# Patient Record
Sex: Male | Born: 1980 | Race: White | Hispanic: No | Marital: Married | Smoking: Never smoker
Health system: Southern US, Community
[De-identification: ages and names within clinical notes are randomized; demographics above are authoritative.]

## PROBLEM LIST (undated history)

## (undated) DIAGNOSIS — R030 Elevated blood-pressure reading, without diagnosis of hypertension: Secondary | ICD-10-CM

## (undated) DIAGNOSIS — E349 Endocrine disorder, unspecified: Secondary | ICD-10-CM

## (undated) DIAGNOSIS — IMO0001 Reserved for inherently not codable concepts without codable children: Secondary | ICD-10-CM

## (undated) HISTORY — DX: Endocrine disorder, unspecified: E34.9

## (undated) HISTORY — DX: Reserved for inherently not codable concepts without codable children: IMO0001

## (undated) HISTORY — DX: Elevated blood-pressure reading, without diagnosis of hypertension: R03.0

## (undated) HISTORY — PX: HERNIA REPAIR: SHX51

## (undated) HISTORY — PX: BACK SURGERY: SHX140

---
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
## 2008-02-13 ENCOUNTER — Emergency Department: Payer: Self-pay | Admitting: Emergency Medicine

## 2009-10-05 IMAGING — CR DG CHEST 2V
1 series · 2 of 2 positions shown · non-contrast
Comparison: none

REASON FOR EXAM: cough, fever, sob
COMMENTS:   LMP: male

PROCEDURE:     DXR - DXR CHEST PA (OR AP) AND LATERAL  - February 13, 2008  [DATE]
RESULT:     The lung fields are clear. The heart, mediastinal and osseous
structures show no significant abnormalities.

[Series 1: view not recorded · 0.17mm/px · 2 of 2 slices shown]
[im 1/2]
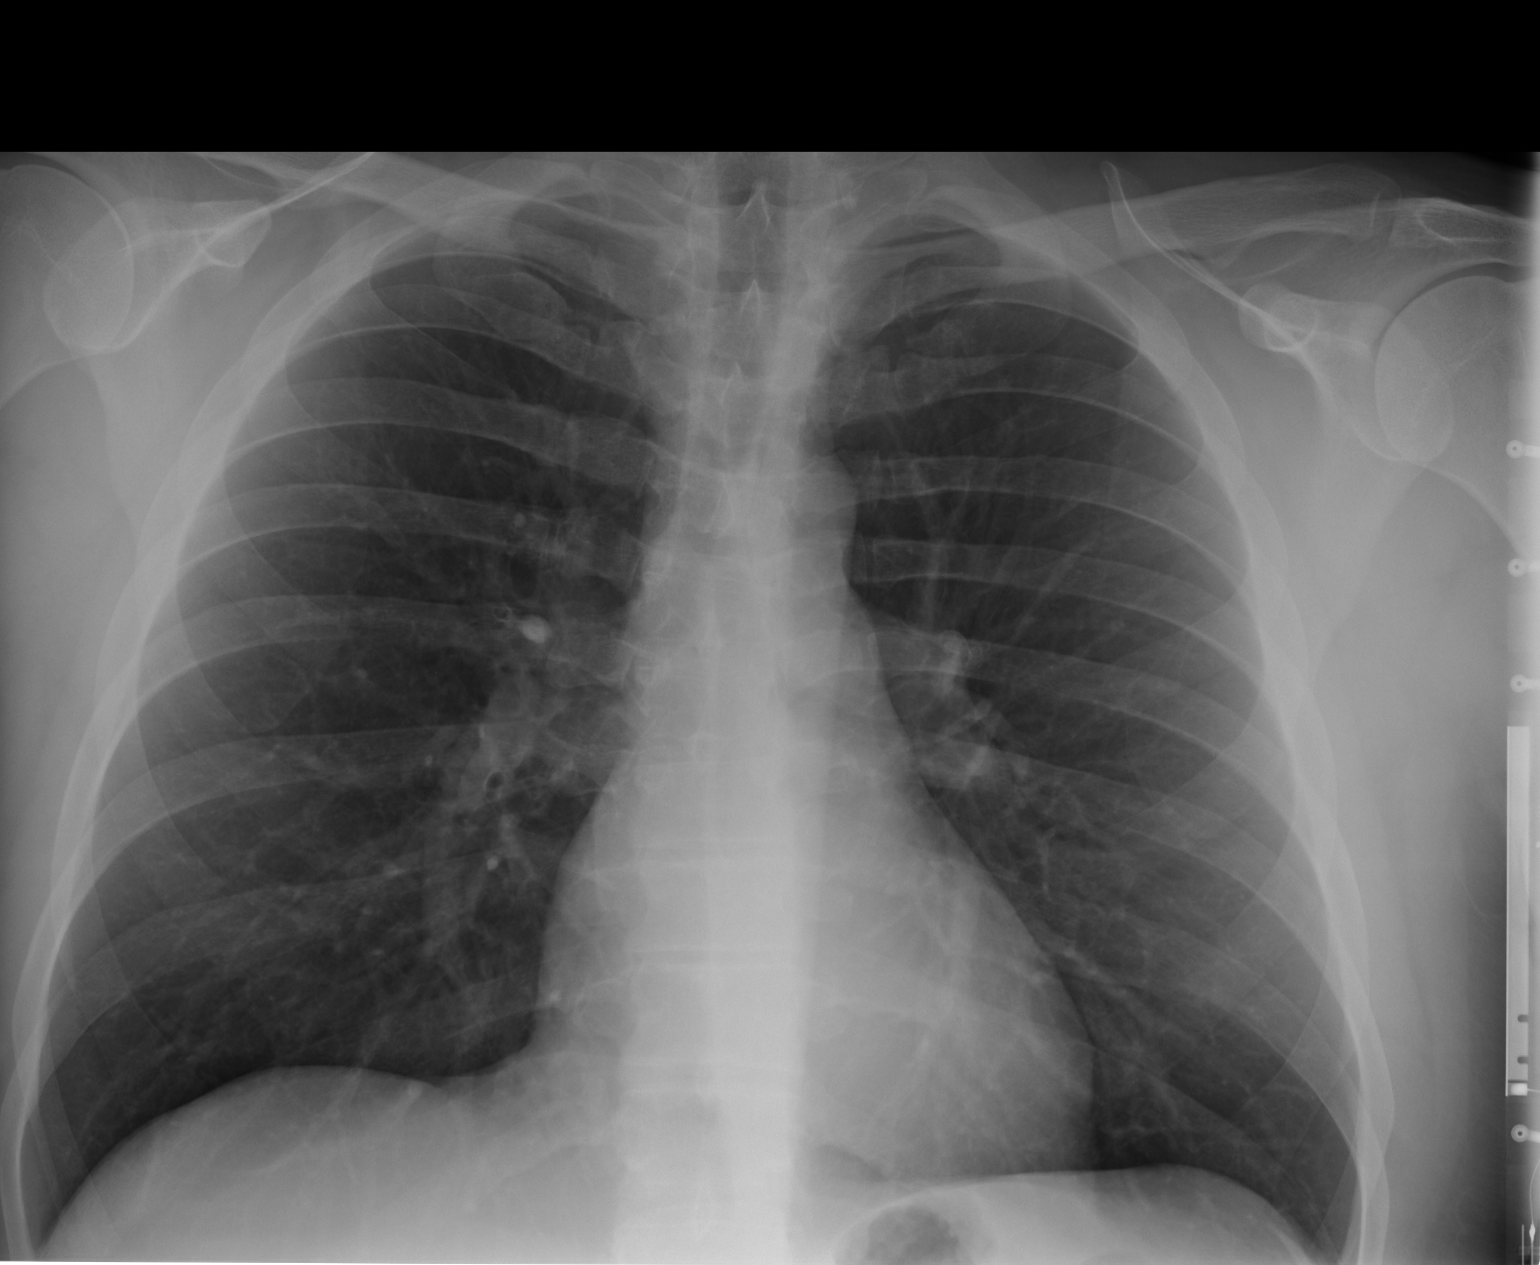
[im 2/2]
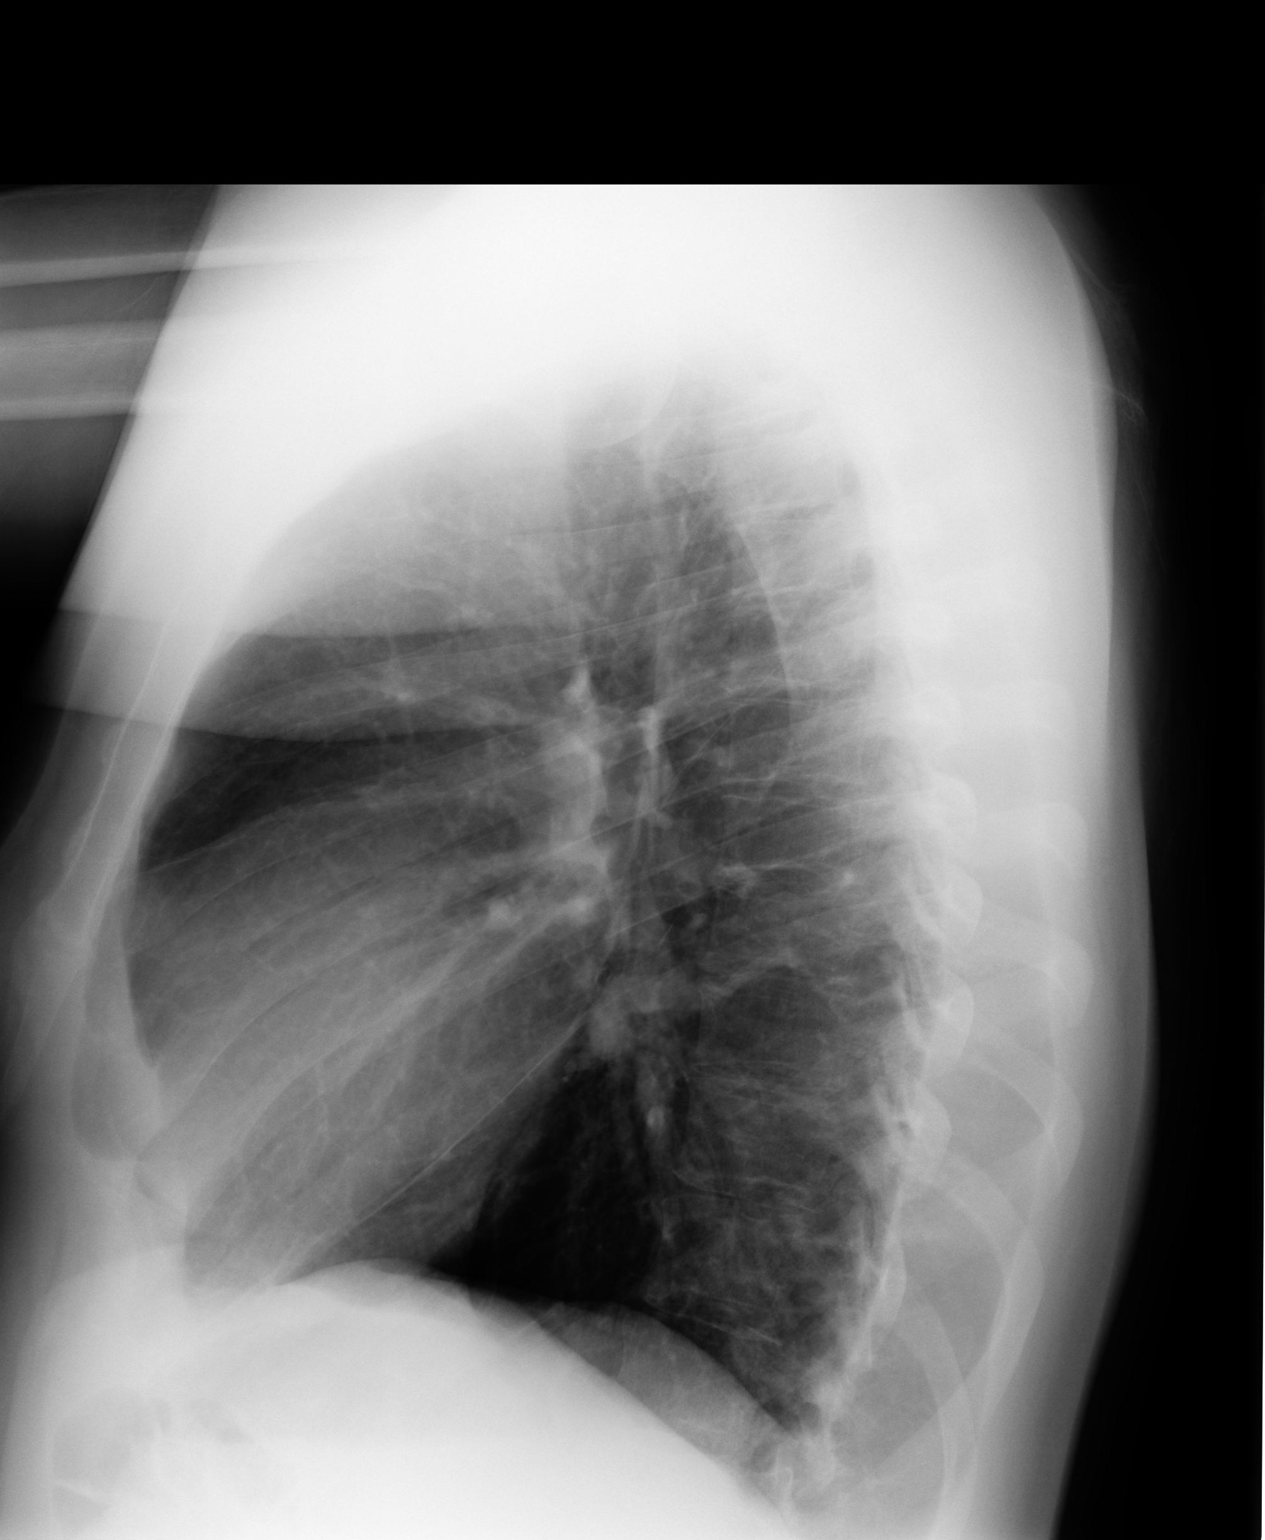

[2 of 2 positions shown; findings below may reference images not displayed]

IMPRESSION: No significant abnormalities are noted.

## 2013-06-15 ENCOUNTER — Emergency Department: Payer: Self-pay | Admitting: Emergency Medicine

## 2014-05-15 ENCOUNTER — Encounter: Payer: Self-pay | Admitting: Primary Care

## 2014-05-15 ENCOUNTER — Encounter (INDEPENDENT_AMBULATORY_CARE_PROVIDER_SITE_OTHER): Payer: Self-pay

## 2014-05-15 ENCOUNTER — Ambulatory Visit (INDEPENDENT_AMBULATORY_CARE_PROVIDER_SITE_OTHER): Payer: 59 | Admitting: Primary Care

## 2014-05-15 VITALS — BP 126/84 | HR 72 | Temp 98.3°F | Ht 73.0 in | Wt 260.8 lb

## 2014-05-15 DIAGNOSIS — N529 Male erectile dysfunction, unspecified: Secondary | ICD-10-CM

## 2014-05-15 DIAGNOSIS — R5383 Other fatigue: Secondary | ICD-10-CM

## 2014-05-15 DIAGNOSIS — R079 Chest pain, unspecified: Secondary | ICD-10-CM | POA: Diagnosis not present

## 2014-05-15 NOTE — Progress Notes (Signed)
Pre visit review using our clinic review tool, if applicable. No additional management support is needed unless otherwise documented below in the visit note. 

## 2014-05-15 NOTE — Progress Notes (Signed)
Subjective:    Patient ID: Daniel Sosa, male    DOB: 18-Jun-1980, 34 y.o.   MRN: [id number]  HPI  [name] is a 34 year old male who presents today to establish care and discuss the problems mentioned below. Will obtain old records.  1) Elevated blood pressure readings: He's been checking his pressures occasionally at [facility] and [facility]. Last week at [facility] his blood pressure wa 155/91, today at [facility] was 151/89. Denies headaches, chest pain. He's cut back on his soda's for the last several months and has one with dinner. Diet consists of a protein bar in the morning, skips lunch, and will eat out and eat tacos, burgers, etc for dinner. He is drinking mostly water, about 6 bottles a day. He will occasionally drink beer. He's lost 10 pounds in the past month due to stress.   BP Readings from Last 3 Encounters:  05/15/14 126/84   2) Chest pain: Started about one month ago and is located to the left side. He describes it as an intermittent, achy pain. Each episode will last for a few minutes and will occur 2 times a week. Denies palpitations and shortness of breath.  3) Decreased libido: Cannot maintain an errection for the past month. Denies penile discharge or testicular pain/abnormalities. Does not take medication over the counter except OTC ibuprofen. He's not using condoms or lube with scents. Consistently happening over the past couple of weeks. Will start out strong and then soften. He also reports generalized fatigue and feels tired often.  Review of Systems  Constitutional: Negative for unexpected weight change.  HENT: Negative for rhinorrhea.   Respiratory: Negative for cough and shortness of breath.   Cardiovascular: Positive for chest pain. Negative for palpitations and leg swelling.  Genitourinary: Negative for dysuria, frequency, penile swelling, scrotal swelling and testicular pain.  Musculoskeletal: Negative for myalgias, joint swelling and arthralgias.  Skin: Negative for  rash.  Neurological: Negative for dizziness and headaches.  Psychiatric/Behavioral:       Denies concerns for anxiety or depression       Past Medical History  Diagnosis Date  . Elevated blood pressure     History   Social History  . Marital Status: Legally Separated    Spouse Name: N/A  . Number of Children: N/A  . Years of Education: N/A   Occupational History  . Not on file.   Social History Main Topics  . Smoking status: Never Smoker   . Smokeless tobacco: Not on file  . Alcohol Use: No  . Drug Use: Not on file  . Sexual Activity: Not on file   Other Topics Concern  . Not on file   Social History Narrative   Single.   Works for [employer] for 3 years.   Had 1 daughter, 63 year old.   Enjoys playing tennis, basketball.       Past Surgical History  Procedure Laterality Date  . Back surgery    . Hernia repair      Age 9    Family History  Problem Relation Age of Onset  . Cancer Mother     Cervical   . Hypertension Mother   . Stroke Maternal Grandmother     No Known Allergies  No current outpatient prescriptions on file prior to visit.   No current facility-administered medications on file prior to visit.    BP 126/84 mmHg  Pulse 72  Temp(Src) 98.3 F (36.8 C) (Oral)  Ht  (1.854  m)  Wt 260 lb 12.8 oz (118.298 kg)  BMI 34.42 kg/m2  SpO2 97%    Objective:   Physical Exam  Constitutional: He is oriented to person, place, and time. He appears well-developed.  HENT:  Right Ear: External ear normal.  Left Ear: External ear normal.  Nose: Nose normal.  Mouth/Throat: Oropharynx is clear and moist.  Eyes: EOM are normal. Pupils are equal, round, and reactive to light.  Neck: Neck supple. No thyromegaly present.  Cardiovascular: Normal rate and regular rhythm.   Pulmonary/Chest: Effort normal and breath sounds normal.  Abdominal: Soft. Bowel sounds are normal. There is no tenderness.  Musculoskeletal: Normal range of motion.    Lymphadenopathy:    He has no cervical adenopathy.  Neurological: He is alert and oriented to person, place, and time.  Skin: Skin is warm and dry.  Psychiatric: He has a normal mood and affect.          Assessment & Plan:

## 2014-05-15 NOTE — Patient Instructions (Signed)
Complete lab work prior to leaving today. I will notify you of your results. Measure your blood pressure daily and start recording. Bring them with you to your next appointment.  Schedule a physical with me in the next 2 weeks. Make a lab appointment to obtain your cholesterol level, come fasting at least 4 hours. It was a pleasure to meet you today! Please don't hesitate to call me with any questions. Welcome to [facility]!

## 2014-05-16 ENCOUNTER — Telehealth: Payer: Self-pay | Admitting: Primary Care

## 2014-05-16 DIAGNOSIS — R079 Chest pain, unspecified: Secondary | ICD-10-CM | POA: Insufficient documentation

## 2014-05-16 DIAGNOSIS — R5383 Other fatigue: Secondary | ICD-10-CM | POA: Insufficient documentation

## 2014-05-16 DIAGNOSIS — N529 Male erectile dysfunction, unspecified: Secondary | ICD-10-CM | POA: Insufficient documentation

## 2014-05-16 DIAGNOSIS — E349 Endocrine disorder, unspecified: Secondary | ICD-10-CM | POA: Insufficient documentation

## 2014-05-16 LAB — CBC WITH DIFFERENTIAL/PLATELET
Basophils Absolute: 0 10*3/uL (ref 0.0–0.1)
Basophils Relative: 0.5 % (ref 0.0–3.0)
Eosinophils Absolute: 0.2 10*3/uL (ref 0.0–0.7)
Eosinophils Relative: 2 % (ref 0.0–5.0)
HCT: 44.7 % (ref 39.0–52.0)
Hemoglobin: 15.2 g/dL (ref 13.0–17.0)
Lymphocytes Relative: 24.4 % (ref 12.0–46.0)
Lymphs Abs: 2.1 10*3/uL (ref 0.7–4.0)
MCHC: 34 g/dL (ref 30.0–36.0)
MCV: 87.4 fl (ref 78.0–100.0)
Monocytes Absolute: 0.5 10*3/uL (ref 0.1–1.0)
Monocytes Relative: 5.4 % (ref 3.0–12.0)
Neutro Abs: 5.8 10*3/uL (ref 1.4–7.7)
Neutrophils Relative %: 67.7 % (ref 43.0–77.0)
Platelets: 235 10*3/uL (ref 150.0–400.0)
RBC: 5.11 Mil/uL (ref 4.22–5.81)
RDW: 13.1 % (ref 11.5–15.5)
WBC: 8.5 10*3/uL (ref 4.0–10.5)

## 2014-05-16 LAB — COMPREHENSIVE METABOLIC PANEL
ALT: 21 U/L (ref 0–53)
AST: 20 U/L (ref 0–37)
Albumin: 4.7 g/dL (ref 3.5–5.2)
Alkaline Phosphatase: 69 U/L (ref 39–117)
BUN: 12 mg/dL (ref 6–23)
CO2: 31 mEq/L (ref 19–32)
Calcium: 9.8 mg/dL (ref 8.4–10.5)
Chloride: 102 mEq/L (ref 96–112)
Creatinine, Ser: 1.09 mg/dL (ref 0.40–1.50)
GFR: 83.35 mL/min (ref >60.00)
Glucose, Bld: 98 mg/dL (ref 70–99)
Potassium: 4 mEq/L (ref 3.5–5.1)
Sodium: 139 mEq/L (ref 135–145)
Total Bilirubin: 0.5 mg/dL (ref 0.2–1.2)
Total Protein: 7.7 g/dL (ref 6.0–8.3)

## 2014-05-16 LAB — TSH: TSH: 1.61 u[IU]/mL (ref 0.35–4.50)

## 2014-05-16 LAB — TESTOSTERONE: Testosterone: 206.51 ng/dL — ABNORMAL LOW (ref 300.00–890.00)

## 2014-05-16 NOTE — Assessment & Plan Note (Signed)
Intermittent, likely due to stress. ECG obtained and is normal. Will continue to monitor.

## 2014-05-16 NOTE — Assessment & Plan Note (Signed)
Present for 2 months. TSH, CBC, BMP unremarkable. Testosterone level low.  Will repeat for an AM sample, if still low then will refer to Urology. Spoke with patient who verbalized understanding.

## 2014-05-16 NOTE — Telephone Encounter (Signed)
Notified patient of his lab results including his low testosterone levels and the plan for treatment. He is to come in the morning for a re-draw on Monday 4/18. He verbalized understanding of the treatment plan.

## 2014-05-16 NOTE — Assessment & Plan Note (Signed)
Likely due to low testosterone level. Will repeat for AM draw.

## 2014-05-20 ENCOUNTER — Telehealth: Payer: Self-pay | Admitting: Primary Care

## 2014-05-20 ENCOUNTER — Other Ambulatory Visit (INDEPENDENT_AMBULATORY_CARE_PROVIDER_SITE_OTHER): Payer: 59

## 2014-05-20 DIAGNOSIS — N529 Male erectile dysfunction, unspecified: Secondary | ICD-10-CM | POA: Diagnosis not present

## 2014-05-20 DIAGNOSIS — R7989 Other specified abnormal findings of blood chemistry: Secondary | ICD-10-CM

## 2014-05-20 LAB — TESTOSTERONE: Testosterone: 213.57 ng/dL — ABNORMAL LOW (ref 300.00–890.00)

## 2014-05-20 NOTE — Telephone Encounter (Signed)
Notified Mr. Daniel Sosa if his lab results. Will make a referral to urology for follow up of low testosterone levels, patient verbalized understanding of his plan of care.

## 2014-05-24 ENCOUNTER — Other Ambulatory Visit: Payer: Self-pay | Admitting: Primary Care

## 2014-05-24 DIAGNOSIS — Z0000 Encounter for general adult medical examination without abnormal findings: Secondary | ICD-10-CM

## 2014-05-27 ENCOUNTER — Other Ambulatory Visit (INDEPENDENT_AMBULATORY_CARE_PROVIDER_SITE_OTHER): Payer: 59

## 2014-05-27 DIAGNOSIS — Z0000 Encounter for general adult medical examination without abnormal findings: Secondary | ICD-10-CM | POA: Diagnosis not present

## 2014-05-28 LAB — LIPID PANEL
Cholesterol: 161 mg/dL (ref 0–200)
HDL: 41.2 mg/dL (ref >39.00)
LDL Cholesterol: 103 mg/dL — ABNORMAL HIGH (ref 0–99)
NonHDL: 119.8
Total CHOL/HDL Ratio: 4
Triglycerides: 82 mg/dL (ref 0.0–149.0)
VLDL: 16.4 mg/dL (ref 0.0–40.0)

## 2014-05-30 ENCOUNTER — Encounter: Payer: Self-pay | Admitting: Primary Care

## 2014-05-30 ENCOUNTER — Ambulatory Visit (INDEPENDENT_AMBULATORY_CARE_PROVIDER_SITE_OTHER): Payer: 59 | Admitting: Primary Care

## 2014-05-30 VITALS — BP 128/80 | HR 66 | Temp 98.1°F | Ht 73.0 in | Wt 251.4 lb

## 2014-05-30 DIAGNOSIS — N529 Male erectile dysfunction, unspecified: Secondary | ICD-10-CM

## 2014-05-30 DIAGNOSIS — R5383 Other fatigue: Secondary | ICD-10-CM | POA: Diagnosis not present

## 2014-05-30 DIAGNOSIS — Z0000 Encounter for general adult medical examination without abnormal findings: Secondary | ICD-10-CM | POA: Insufficient documentation

## 2014-05-30 NOTE — Progress Notes (Signed)
Subjective:    Patient ID: Daniel Sosa, male    DOB: 12/17/1980, 34 y.o.   MRN: [id number]  HPI  Daniel Sosa is a 34 year old male who presents today for complete physical.  Immunizations: -Tetanus: Last Tetanus completed 2016 at [facility] due to cut in his hand. -Influenza: Did not get last season  Diet: He's been working hard to improve his diet. He's cut out fast food.  Breakfast: Fruit. Lunch: Sub[employer] sandwich ([place]key or club) or salad, Dinner: [employer], [employer] meats, vegetables. Also taking a daily vitamin. Exercise: Not exercising daily, but active at work. Dental exam: Has not been in several years.  Wt Readings from Last 3 Encounters:  05/30/14 251 lb 6.4 oz (114.034 kg)  05/15/14 260 lb 12.8 oz (118.298 kg)    Review of Systems  Constitutional: Negative for unexpected weight change.  HENT: Negative for rhinorrhea.   Respiratory: Negative for cough and shortness of breath.   Cardiovascular: Negative for chest pain.  Gastrointestinal: Negative for diarrhea and constipation.  Genitourinary: Negative for dysuria, frequency, scrotal swelling, difficulty urinating and testicular pain.  Musculoskeletal: Negative for myalgias and arthralgias.  Skin: Negative for rash.  Neurological: Negative for dizziness and headaches.  Psychiatric/Behavioral:       Denies concerns for anxiety or depression       Past Medical History  Diagnosis Date  . Elevated blood pressure     History   Social History  . Marital Status: Legally Separated    Spouse Name: N/A  . Number of Children: N/A  . Years of Education: N/A   Occupational History  . Not on file.   Social History Main Topics  . Smoking status: Never Smoker   . Smokeless tobacco: Not on file  . Alcohol Use: No  . Drug Use: Not on file  . Sexual Activity: Not on file   Other Topics Concern  . Not on file   Social History Narrative   Single.   Works for [employer] for 3 years.   Had 1 daughter, 32 year old.   Enjoys  playing tennis, basketball.       Past Surgical History  Procedure Laterality Date  . Back surgery    . Hernia repair      Age 34    Family History  Problem Relation Age of Onset  . Cancer Mother     Cervical   . Hypertension Mother   . Stroke Maternal Grandmother     No Known Allergies  No current outpatient prescriptions on file prior to visit.   No current facility-administered medications on file prior to visit.    BP 128/80 mmHg  Pulse 66  Temp(Src) 98.1 F (36.7 C) (Oral)  Ht 6\' 1"  (1.854 m)  Wt 251 lb 6.4 oz (114.034 kg)  BMI 33.18 kg/m2  SpO2 98%    Objective:   Physical Exam  Constitutional: He is oriented to person, place, and time. He appears well-developed.  HENT:  Right Ear: Tympanic membrane and ear canal normal.  Left Ear: Tympanic membrane and ear canal normal.  Eyes: Conjunctivae and EOM are normal. Pupils are equal, round, and reactive to light.  Neck: Neck supple.  Cardiovascular: Normal rate and regular rhythm.   Pulmonary/Chest: Effort normal and breath sounds normal.  Abdominal: Soft. Bowel sounds are normal. He exhibits no mass. There is no tenderness.  Musculoskeletal: Normal range of motion.  Lymphadenopathy:    He has no cervical adenopathy.  Neurological: He is alert  and oriented to person, place, and time. He has normal reflexes. No cranial nerve deficit.  Skin: Skin is warm and dry.  Psychiatric: He has a normal mood and affect.          Assessment & Plan:

## 2014-05-30 NOTE — Assessment & Plan Note (Signed)
Testesterone level continues to be low during AM repeat. Referral made to urology. Patient has appointment in June. CBC, CMP unremarkable.

## 2014-05-30 NOTE — Assessment & Plan Note (Addendum)
All labs unremarkable. Vaccines up to date. He is improving his diet and has lost 9 pounds since his last visit. Recommended that he use sunscreen to his arms as they are burned today. 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 Follow up in one year or sooner if needed.

## 2014-05-30 NOTE — Assessment & Plan Note (Signed)
Testesterone level continues to be low during AM repeat. Referral made to urology. Patient has appointment in June. CBC, CMP unremarkable. 

## 2014-05-30 NOTE — Patient Instructions (Signed)
Continue your efforts at improving your diet. You've lost 9 pounds since your last visit which is excellent! Use sunscreen on your arms while driving to prevent skin cancer. Call me or send me a message once you've seen urology. Follow up in one year for physical exam or sooner if you need me.

## 2014-05-30 NOTE — Progress Notes (Signed)
Pre visit review using our clinic review tool, if applicable. No additional management support is needed unless otherwise documented below in the visit note. 

## 2015-05-20 ENCOUNTER — Ambulatory Visit (INDEPENDENT_AMBULATORY_CARE_PROVIDER_SITE_OTHER): Payer: 59 | Admitting: Family Medicine

## 2015-05-20 ENCOUNTER — Encounter: Payer: Self-pay | Admitting: *Deleted

## 2015-05-20 ENCOUNTER — Encounter: Payer: Self-pay | Admitting: Family Medicine

## 2015-05-20 VITALS — BP 120/80 | HR 76 | Temp 99.6°F | Ht 73.0 in | Wt 244.3 lb

## 2015-05-20 DIAGNOSIS — R197 Diarrhea, unspecified: Secondary | ICD-10-CM

## 2015-05-20 DIAGNOSIS — R1110 Vomiting, unspecified: Secondary | ICD-10-CM | POA: Diagnosis not present

## 2015-05-20 MED ORDER — ONDANSETRON HCL 4 MG PO TABS: 4.0000 mg | ORAL_TABLET | Freq: Three times a day (TID) | ORAL | Status: DC | PRN

## 2015-05-20 NOTE — Patient Instructions (Signed)
Please drink plenty of fluids with electrolytes you are not eating.  Use Imodium as needed for diarrhea.  Can use the Zofran if needed for nausea and vomiting.  No dairy for 1 week.  Follow-up with your doctor if your symptoms worsen or persist.  Viral Gastroenteritis Viral gastroenteritis is also known as stomach flu. This condition affects the stomach and intestinal tract. It can cause sudden diarrhea and vomiting. The illness typically lasts 3 to 8 days. Most people develop an immune response that eventually gets rid of the virus. While this natural response develops, the virus can make you quite ill. CAUSES  Many different viruses can cause gastroenteritis, such as rotavirus or noroviruses. You can catch one of these viruses by consuming contaminated food or water. You may also catch a virus by sharing utensils or other personal items with an infected person or by touching a contaminated surface. SYMPTOMS  The most common symptoms are diarrhea and vomiting. These problems can cause a severe loss of body fluids (dehydration) and a body salt (electrolyte) imbalance. Other symptoms may include:  Fever.  Headache.  Fatigue.  Abdominal pain. DIAGNOSIS  Your caregiver can usually diagnose viral gastroenteritis based on your symptoms and a physical exam. A stool sample may also be taken to test for the presence of viruses or other infections. TREATMENT  This illness typically goes away on its own. Treatments are aimed at rehydration. The most serious cases of viral gastroenteritis involve vomiting so severely that you are not able to keep fluids down. In these cases, fluids must be given through an intravenous line (IV). HOME CARE INSTRUCTIONS   Drink enough fluids to keep your urine clear or pale yellow. Drink small amounts of fluids frequently and increase the amounts as tolerated.  Ask your caregiver for specific rehydration instructions.  Avoid:  Foods high in  sugar.  Alcohol.  Carbonated drinks.  Tobacco.  Juice.  Caffeine drinks.  Extremely hot or cold fluids.  Fatty, greasy foods.  Too much intake of anything at one time.  Dairy products until 24 to 48 hours after diarrhea stops.  You may consume probiotics. Probiotics are active cultures of beneficial bacteria. They may lessen the amount and number of diarrheal stools in adults. Probiotics can be found in yogurt with active cultures and in supplements.  Wash your hands well to avoid spreading the virus.  Only take over-the-counter or prescription medicines for pain, discomfort, or fever as directed by your caregiver. Do not give aspirin to children. Antidiarrheal medicines are not recommended.  Ask your caregiver if you should continue to take your regular prescribed and over-the-counter medicines.  Keep all follow-up appointments as directed by your caregiver. SEEK IMMEDIATE MEDICAL CARE IF:   You are unable to keep fluids down.  You do not urinate at least once every 6 to 8 hours.  You develop shortness of breath.  You notice blood in your stool or vomit. This may look like coffee grounds.  You have abdominal pain that increases or is concentrated in one small area (localized).  You have persistent vomiting or diarrhea.  You have a fever.  The patient is a child younger than 3 months, and he or she has a fever.  The patient is a child older than 3 months, and he or she has a fever and persistent symptoms.  The patient is a child older than 3 months, and he or she has a fever and symptoms suddenly get worse.  The patient is a  baby, and he or she has no tears when crying. MAKE SURE YOU:   Understand these instructions.  Will watch your condition.  Will get help right away if you are not doing well or get worse.   This information is not intended to replace advice given to you by your health care provider. Make sure you discuss any questions you have with your  health care provider.   Document Released: 01/18/2005 Document Revised: 04/12/2011 Document Reviewed: 11/04/2010 Elsevier Interactive Patient Education [employer]

## 2015-05-20 NOTE — Progress Notes (Signed)
Pre visit review using our clinic review tool, if applicable. No additional management support is needed unless otherwise documented below in the visit note. 

## 2015-05-20 NOTE — Progress Notes (Signed)
HPI:  Daniel Sosa is a 35 year old here for an acute visit for nausea, vomiting and diarrhea. These symptoms started 2 days ago and now are improving. He had acute onset of watery nonbloody diarrhea, nonbilious and nonbloody emesis, fatigue, low-grade fever and upper respiratory symptoms. The diarrhea and vomiting have improved and he has had no vomiting in 2 days. However he has had several episodes of loose bowels today and persistent nausea. Denies hematochezia, melena, persistent or focal abdominal pain, shortness of breath, recent travel or antibiotics. He did take Pepto-Bismol and a few doses of Imodium.   ROS: See pertinent positives and negatives per HPI.  Past Medical History  Diagnosis Date  . Elevated blood pressure     Past Surgical History  Procedure Laterality Date  . Back surgery    . Hernia repair      Age 44    Family History  Problem Relation Age of Onset  . Cancer Mother     Cervical   . Hypertension Mother   . Stroke Maternal Grandmother     Social History   Social History  . Marital Status: Legally Separated    Spouse Name: N/A  . Number of Children: N/A  . Years of Education: N/A   Social History Main Topics  . Smoking status: Never Smoker   . Smokeless tobacco: None  . Alcohol Use: No  . Drug Use: None  . Sexual Activity: Not Asked   Other Topics Concern  . None   Social History Narrative   Single.   Works for [employer] for 3 years.   Had 1 daughter, 20 year old.   Enjoys playing tennis, basketball.        Current outpatient prescriptions:  Marland Kitchen  VIAGRA 100 MG tablet, TAKE 1 TABLET AS DIRECTED, Disp: , Rfl: 11 .  ondansetron (ZOFRAN) 4 MG tablet, Take 1 tablet (4 mg total) by mouth every 8 (eight) hours as needed for nausea or vomiting., Disp: 20 tablet, Rfl: 0  EXAM:  Filed Vitals:   05/20/15 1351  BP: 120/80  Pulse: 76  Temp: 99.6 F (37.6 C)    Body mass index is 32.24 kg/(m^2).  GENERAL: vitals reviewed and listed  above, alert, oriented, appears well hydrated and in no acute distress  HEENT: atraumatic, conjunttiva clear, no obvious abnormalities on inspection of external nose and ears  NECK: no obvious masses on inspection  LUNGS: clear to auscultation bilaterally, no wheezes, rales or rhonchi, good air movement  CV: HRRR, no peripheral edema  ABD: BS+, soft, Nontender to palpation without rebound or guarding   MS: moves all extremities without noticeable abnormality  PSYCH: pleasant and cooperative, no obvious depression or anxiety  ASSESSMENT AND PLAN:  Discussed the following assessment and plan:  Vomiting and diarrhea  -we discussed possible serious and likely etiologies, workup and treatment, transmission, treatment risks and return precautions with viral gastroenteritis most likely -after this discussion, Daniel Sosa opted for supportive care, antiemetic in close follow-up as needed -of course, we advised Daniel Sosa  to return or notify a doctor immediately if symptoms worsen or persist or new concerns arise.  -Patient advised to return or notify a doctor immediately if symptoms worsen or persist or new concerns arise.  Patient Instructions  Please drink plenty of fluids with electrolytes you are not eating.  Use Imodium as needed for diarrhea.  Can use the Zofran if needed for nausea and vomiting.  No dairy for 1 week.  Follow-up with your doctor if  your symptoms worsen or persist.  Viral Gastroenteritis Viral gastroenteritis is also known as stomach flu. This condition affects the stomach and intestinal tract. It can cause sudden diarrhea and vomiting. The illness typically lasts 3 to 8 days. Most people develop an immune response that eventually gets rid of the virus. While this natural response develops, the virus can make you quite ill. CAUSES  Many different viruses can cause gastroenteritis, such as rotavirus or noroviruses. You can catch one of these viruses by  consuming contaminated food or water. You may also catch a virus by sharing utensils or other personal items with an infected person or by touching a contaminated surface. SYMPTOMS  The most common symptoms are diarrhea and vomiting. These problems can cause a severe loss of body fluids (dehydration) and a body salt (electrolyte) imbalance. Other symptoms may include:  Fever.  Headache.  Fatigue.  Abdominal pain. DIAGNOSIS  Your caregiver can usually diagnose viral gastroenteritis based on your symptoms and a physical exam. A stool sample may also be taken to test for the presence of viruses or other infections. TREATMENT  This illness typically goes away on its own. Treatments are aimed at rehydration. The most serious cases of viral gastroenteritis involve vomiting so severely that you are not able to keep fluids down. In these cases, fluids must be given through an intravenous line (IV). HOME CARE INSTRUCTIONS   Drink enough fluids to keep your urine clear or pale yellow. Drink small amounts of fluids frequently and increase the amounts as tolerated.  Ask your caregiver for specific rehydration instructions.  Avoid:  Foods high in sugar.  Alcohol.  Carbonated drinks.  Tobacco.  Juice.  Caffeine drinks.  Extremely hot or cold fluids.  Fatty, greasy foods.  Too much intake of anything at one time.  Dairy products until 24 to 48 hours after diarrhea stops.  You may consume probiotics. Probiotics are active cultures of beneficial bacteria. They may lessen the amount and number of diarrheal stools in adults. Probiotics can be found in yogurt with active cultures and in supplements.  Wash your hands well to avoid spreading the virus.  Only take over-the-counter or prescription medicines for pain, discomfort, or fever as directed by your caregiver. Do not give aspirin to children. Antidiarrheal medicines are not recommended.  Ask your caregiver if you should continue to  take your regular prescribed and over-the-counter medicines.  Keep all follow-up appointments as directed by your caregiver. SEEK IMMEDIATE MEDICAL CARE IF:   You are unable to keep fluids down.  You do not urinate at least once every 6 to 8 hours.  You develop shortness of breath.  You notice blood in your stool or vomit. This may look like coffee grounds.  You have abdominal pain that increases or is concentrated in one small area (localized).  You have persistent vomiting or diarrhea.  You have a fever.  The patient is a child younger than 3 months, and he or she has a fever.  The patient is a child older than 3 months, and he or she has a fever and persistent symptoms.  The patient is a child older than 3 months, and he or she has a fever and symptoms suddenly get worse.  The patient is a baby, and he or she has no tears when crying. MAKE SURE YOU:   Understand these instructions.  Will watch your condition.  Will get help right away if you are not doing well or get worse.  This information is not intended to replace advice given to you by your health care provider. Make sure you discuss any questions you have with your health care provider.   Document Released: 01/18/2005 Document Revised: 04/12/2011 Document Reviewed: 11/04/2010 Elsevier Interactive Patient Education [address]      [place], [place]

## 2015-07-02 ENCOUNTER — Encounter: Payer: Self-pay | Admitting: Family Medicine

## 2015-07-02 ENCOUNTER — Ambulatory Visit (INDEPENDENT_AMBULATORY_CARE_PROVIDER_SITE_OTHER): Payer: 59 | Admitting: Family Medicine

## 2015-07-02 VITALS — BP 126/78 | HR 71 | Temp 98.5°F | Ht 73.0 in | Wt 248.0 lb

## 2015-07-02 DIAGNOSIS — R1084 Generalized abdominal pain: Secondary | ICD-10-CM | POA: Diagnosis not present

## 2015-07-02 DIAGNOSIS — R040 Epistaxis: Secondary | ICD-10-CM

## 2015-07-02 DIAGNOSIS — R112 Nausea with vomiting, unspecified: Secondary | ICD-10-CM

## 2015-07-02 LAB — HEPATIC FUNCTION PANEL
ALBUMIN: 4.7 g/dL (ref 3.5–5.2)
ALK PHOS: 61 U/L (ref 39–117)
ALT: 20 U/L (ref 0–53)
AST: 23 U/L (ref 0–37)
Bilirubin, Direct: 0.1 mg/dL (ref 0.0–0.3)
TOTAL PROTEIN: 7.1 g/dL (ref 6.0–8.3)
Total Bilirubin: 0.6 mg/dL (ref 0.2–1.2)

## 2015-07-02 LAB — BASIC METABOLIC PANEL
BUN: 12 mg/dL (ref 6–23)
CALCIUM: 9.1 mg/dL (ref 8.4–10.5)
CO2: 33 mEq/L — ABNORMAL HIGH (ref 19–32)
Chloride: 101 mEq/L (ref 96–112)
Creatinine, Ser: 0.88 mg/dL (ref 0.40–1.50)
GFR: 105.95 mL/min (ref >60.00)
Glucose, Bld: 91 mg/dL (ref 70–99)
Potassium: 3.8 mEq/L (ref 3.5–5.1)
SODIUM: 138 meq/L (ref 135–145)

## 2015-07-02 LAB — CBC WITH DIFFERENTIAL/PLATELET
BASOS ABS: 0 10*3/uL (ref 0.0–0.1)
BASOS PCT: 0.3 % (ref 0.0–3.0)
EOS PCT: 3.2 % (ref 0.0–5.0)
Eosinophils Absolute: 0.2 10*3/uL (ref 0.0–0.7)
HEMATOCRIT: 42.9 % (ref 39.0–52.0)
Hemoglobin: 14.4 g/dL (ref 13.0–17.0)
LYMPHS PCT: 21.7 % (ref 12.0–46.0)
Lymphs Abs: 1.3 10*3/uL (ref 0.7–4.0)
MCHC: 33.5 g/dL (ref 30.0–36.0)
MCV: 87 fl (ref 78.0–100.0)
MONOS PCT: 7 % (ref 3.0–12.0)
Monocytes Absolute: 0.4 10*3/uL (ref 0.1–1.0)
NEUTROS ABS: 3.9 10*3/uL (ref 1.4–7.7)
Neutrophils Relative %: 67.8 % (ref 43.0–77.0)
PLATELETS: 195 10*3/uL (ref 150.0–400.0)
RBC: 4.94 Mil/uL (ref 4.22–5.81)
RDW: 13.2 % (ref 11.5–15.5)
WBC: 5.8 10*3/uL (ref 4.0–10.5)

## 2015-07-02 LAB — H. PYLORI ANTIBODY, IGG: H PYLORI IGG: NEGATIVE

## 2015-07-02 LAB — LIPASE: Lipase: 21 U/L (ref 11.0–59.0)

## 2015-07-02 NOTE — Progress Notes (Signed)
Pre visit review using our clinic review tool, if applicable. No additional management support is needed unless otherwise documented below in the visit note. 

## 2015-07-02 NOTE — Progress Notes (Signed)
Dr. Karleen Hampshire T. Marvelyn Bouchillon, MD, CAQ [facility] [address] [place], [address] Phone: [phone] Fax: [phone]  07/02/2015  Patient: Daniel Sosa, MRN: [id number], DOB: 04-30-1980, 35 y.o.  Primary Physician:  Morrie Sheldon, NP   Chief Complaint  Patient presents with  . stomach cramps  . Emesis  . Epistaxis    when vomiting last night   Subjective:   Daniel Sosa is a 35 y.o. very pleasant male patient who presents with the following:  Had a ? Stomach bug last week. Did have some diarrhea a couple of times, had a bloody nose.  Nosebleed once a week.  He is been having some stomach cramps, vomiting with a couple of episodes of diarrhea in the last week.  He had something relatively similar 6 weeks ago and saw Dr. Selena Batten.  At that point, she felt like he was having some viral gastroenteritis.  Similar symptoms present again.  He also has been having some intermittent bloody noses, approximately once a week over the last number of weeks.  Past Medical History, Surgical History, Social History, Family History, Problem List, Medications, and Allergies have been reviewed and updated if relevant.  Patient Active Problem List   Diagnosis Date Noted  . Preventative health care 05/30/2014  . Difficulty attaining erection 05/16/2014    Past Medical History  Diagnosis Date  . Elevated blood pressure     Past Surgical History  Procedure Laterality Date  . Back surgery    . Hernia repair      Age 18    Social History   Social History  . Marital Status: Legally Separated    Spouse Name: N/A  . Number of Children: N/A  . Years of Education: N/A   Occupational History  . Not on file.   Social History Main Topics  . Smoking status: Never Smoker   . Smokeless tobacco: Never Used  . Alcohol Use: No  . Drug Use: No  . Sexual Activity: Not on file   Other Topics Concern  . Not on file   Social History  Narrative   Single.   Works for [employer] for 3 years.   Had 1 daughter, 75 year old.   Enjoys playing tennis, basketball.       Family History  Problem Relation Age of Onset  . Cancer Mother     Cervical   . Hypertension Mother   . Stroke Maternal Grandmother     No Known Allergies  Medication list reviewed and updated in full in [facility].  ROS: GEN: Acute illness details above GI: Tolerating PO intake GU: maintaining adequate hydration and urination Pulm: No SOB Interactive and getting along well at home.  Otherwise, ROS is as per the HPI.  Objective:   BP 126/78 mmHg  Pulse 71  Temp(Src) 98.5 F (36.9 C) (Oral)  Ht  (1.854 m)  Wt 248 lb (112.492 kg)  BMI 32.73 kg/m2  GEN: WDWN, NAD, Non-toxic, A & O x 3 HEENT: Atraumatic, Normocephalic. Neck supple. No masses, No LAD. Ears and Nose: No external deformity. CV: RRR, No M/G/R. No JVD. No thrill. No extra heart sounds. PULM: CTA B, no wheezes, crackles, rhonchi. No retractions. No resp. distress. No accessory muscle use. ABD: S, mild-minimal pain on palpation relatively diffusely, ND, +BS. No rebound. No HSM. EXTR: No c/c/e NEURO Normal gait.  PSYCH: Normally interactive. Conversant. Not depressed or anxious appearing.  Calm demeanor.  Laboratory and Imaging Data: Results for orders placed or performed in visit on 07/02/15  Basic metabolic panel  Result Value Ref Range   Sodium 138 135 - 145 mEq/L   Potassium 3.8 3.5 - 5.1 mEq/L   Chloride 101 96 - 112 mEq/L   CO2 33 (H) 19 - 32 mEq/L   Glucose, Bld 91 70 - 99 mg/dL   BUN 12 6 - 23 mg/dL   Creatinine, Ser 0.450.88 0.40 - 1.50 mg/dL   Calcium 9.1 8.4 - 40.910.5 mg/dL   GFR 811.91105.95 >47.82>60.00 mL/min  CBC with Differential/Platelet  Result Value Ref Range   WBC 5.8 4.0 - 10.5 K/uL   RBC 4.94 4.22 - 5.81 Mil/uL   Hemoglobin 14.4 13.0 - 17.0 g/dL   HCT 95.642.9 21.339.0 - 08.652.0 %   MCV 87.0 78.0 - 100.0 fl   MCHC 33.5 30.0 - 36.0 g/dL   RDW 57.813.2 46.911.5 - 62.915.5 %     Platelets 195.0 150.0 - 400.0 K/uL   Neutrophils Relative % 67.8 43.0 - 77.0 %   Lymphocytes Relative 21.7 12.0 - 46.0 %   Monocytes Relative 7.0 3.0 - 12.0 %   Eosinophils Relative 3.2 0.0 - 5.0 %   Basophils Relative 0.3 0.0 - 3.0 %   Neutro Abs 3.9 1.4 - 7.7 K/uL   Lymphs Abs 1.3 0.7 - 4.0 K/uL   Monocytes Absolute 0.4 0.1 - 1.0 K/uL   Eosinophils Absolute 0.2 0.0 - 0.7 K/uL   Basophils Absolute 0.0 0.0 - 0.1 K/uL  Hepatic function panel  Result Value Ref Range   Total Bilirubin 0.6 0.2 - 1.2 mg/dL   Bilirubin, Direct 0.1 0.0 - 0.3 mg/dL   Alkaline Phosphatase 61 39 - 117 U/L   AST 23 0 - 37 U/L   ALT 20 0 - 53 U/L   Total Protein 7.1 6.0 - 8.3 g/dL   Albumin 4.7 3.5 - 5.2 g/dL  H. pylori antibody, IgG  Result Value Ref Range   H Pylori IgG Negative Negative  Lipase  Result Value Ref Range   Lipase 21.0 11.0 - 59.0 U/L     Assessment and Plan:   Abdominal pain, generalized - Plan: Basic metabolic panel, CBC with Differential/Platelet, Hepatic function panel, H. pylori antibody, IgG, Lipase  Non-intractable vomiting with nausea, vomiting of unspecified type - Plan: Basic metabolic panel, CBC with Differential/Platelet, Hepatic function panel, H. pylori antibody, IgG, Lipase  Epistaxis  Labs are all very reassuring.  CBC is normal.  Certainly gastroenteritis would be common.  This may be a component of some dyspepsia.  I may have him try some Zantac b.i.d. To see if it helps over the next 3 or 4 weeks.  As far as epistaxis, I probably would not do anything unless it becomes significantly bothersome to him.  ENT could try to visualize a vessel and cauterize it if it become sufficiently bothersome.  Follow-up: prn  Orders Placed This Encounter  Procedures  . Basic metabolic panel  . CBC with Differential/Platelet  . Hepatic function panel  . H. pylori antibody, IgG  . Lipase    Signed,  Karlin Binion T. Kenlynn Houde, MD   Patient's Medications  New Prescriptions    No medications on file  Previous Medications   ONDANSETRON (ZOFRAN) 4 MG TABLET    Take 1 tablet (4 mg total) by mouth every 8 (eight) hours as needed for nausea or vomiting.   VIAGRA 100 MG TABLET    TAKE 1 TABLET AS DIRECTED  Modified Medications   No medications on file  Discontinued Medications   No medications on file

## 2015-07-04 ENCOUNTER — Telehealth: Payer: Self-pay | Admitting: Primary Care

## 2015-07-04 NOTE — Telephone Encounter (Signed)
Pt called requesting results of lab test on 5/31. Please call [phone] when received.

## 2015-07-05 NOTE — Telephone Encounter (Signed)
LMOM

## 2015-07-07 ENCOUNTER — Encounter: Payer: Self-pay | Admitting: *Deleted

## 2017-04-27 ENCOUNTER — Ambulatory Visit: Payer: 59 | Admitting: Family Medicine

## 2017-04-27 ENCOUNTER — Telehealth: Payer: Self-pay | Admitting: Primary Care

## 2017-04-27 NOTE — Telephone Encounter (Signed)
Copied from CRM [id number]#[id number]. Topic: Quick Communication - See Telephone Encounter >> Apr 27, 2017 11:22 AM Trula SladeWalter, Linda F wrote: CRM for notification. See Telephone encounter for: 04/27/17. Patient has gotten into some poison ivy and he wants to know if the provider can send in a prescription for  Prednisone.

## 2017-04-27 NOTE — Telephone Encounter (Signed)
Spoke to patient by telephone and advised him that he would need an appointment because he has not been seen in the office for almost 2 years. Appointment scheduled with Dr. Para Marchuncan today.

## 2017-04-27 NOTE — Telephone Encounter (Signed)
Noted, agree to exam. Appreciate Dr. Para Marchuncan seeing patient.

## 2017-04-27 NOTE — Telephone Encounter (Signed)
Agree, better to see patient.  Thanks for scheduling.

## 2017-04-28 ENCOUNTER — Ambulatory Visit: Payer: 59 | Admitting: Family Medicine

## 2020-03-03 ENCOUNTER — Telehealth: Payer: Self-pay

## 2020-03-03 NOTE — Telephone Encounter (Signed)
ok'd by [employer].

## 2020-03-03 NOTE — Telephone Encounter (Signed)
[facility] Day - Client TELEPHONE ADVICE RECORD [facility] Patient Name: Daniel Sosa Gender: Male DOB: [birth year] Age: 40 Y 9 M 1 D Return Phone Number: [phone] (Primary) Address: City/State/Zip: [place] [address] Client [facility] Day - Client Client Site [facility] - Day Physician Vernona Rieger - NP Contact Type Call Who Is Calling Patient / Member / Family / Caregiver Call Type Triage / Clinical Relationship To Patient Self Return Phone Number [phone] (Primary) Chief Complaint Blood Pressure High Reason for Call Symptomatic / Request for Health Information Initial Comment Caller states he has some elevated blood pressure issues. Translation No Nurse Assessment Nurse: Gerre Pebbles, RN, Casimiro Needle Date/Time Daniel Sosa Time): 03/03/2020 12:22:01 PM Confirm and document reason for call. If symptomatic, describe symptoms. ---Caller states that he has been having high blood pressure in the 140-150's a month ago. Was in the 170/90 this morning. Does the patient have any new or worsening symptoms? ---Yes Will a triage be completed? ---Yes Related visit to physician within the last 2 weeks? ---No Does the PT have any chronic conditions? (i.e. diabetes, asthma, this includes High risk factors for pregnancy, etc.) ---No Is the patient pregnant or possibly pregnant? (Ask all females between the ages of 45-55) ---No Is this a behavioral health or substance abuse call? ---No Guidelines Guideline Title Affirmed Question Affirmed Notes Nurse Date/Time (Eastern Time) Blood Pressure - High Systolic BP >= 160 OR Diastolic >= 100 Gust Brooms 03/03/2020 12:23:28 PM Disp. Time Daniel Sosa Time) Disposition Final User 03/03/2020 12:25:42 PM SEE PCP WITHIN 3 DAYS Yes Gerre Pebbles, RN, Dawayne Cirri Disagree/Comply Comply Caller Understands Yes PreDisposition Call Doctor PLEASE NOTE: All timestamps contained  within this report are represented as [place] Standard Time. CONFIDENTIALTY NOTICE: This fax transmission is intended only for the addressee. It contains information that is legally privileged, confidential or otherwise protected from use or disclosure. If you are not the intended recipient, you are strictly prohibited from reviewing, disclosing, copying using or disseminating any of this information or taking any action in reliance on or regarding this information. If you have received this fax in error, please notify us immediately by telephone so that we can arrange for its return to [place]. Phone: [phone], Toll-Free: [phone], Fax: [phone] Page: 2 of 2 Call Id: [id number] Care Advice Given Per Guideline SEE PCP WITHIN 3 DAYS: * The goal of blood pressure treatment for most people with hypertension is to keep the blood pressure under 140/90. For people that are 60 years or older, your doctor may instead want to keep the blood pressure under 150/90. CARE ADVICE given per High Blood Pressure (Adult) guideline. CALL BACK IF: * Weakness or numbness of the face, arm or leg on one side of the body occurs * Your blood pressure is over 180/110 * Chest pain or difficulty breathing occurs * Difficulty walking, difficulty talking, or severe headache occurs * You become worse Comments User: Phillips Grout, RN Date/Time Daniel Sosa Time): 03/03/2020 12:27:31 PM Appointment scheduled for Wednesday. Referrals REFERRED TO PCP OFFICE

## 2020-03-03 NOTE — Telephone Encounter (Signed)
Pt was already scheduled appt with Allayne Gitelman NP on 03/05/20 at 8 AM. Pt last seen in office 06/14/2015 and last saw Allayne Gitelman NP on 05/30/2014. Will send note back to [facility] to verify appt Oked.

## 2020-03-05 ENCOUNTER — Encounter: Payer: Self-pay | Admitting: Primary Care

## 2020-03-05 ENCOUNTER — Ambulatory Visit (INDEPENDENT_AMBULATORY_CARE_PROVIDER_SITE_OTHER): Payer: Commercial Managed Care - PPO | Admitting: Primary Care

## 2020-03-05 ENCOUNTER — Other Ambulatory Visit: Payer: Self-pay

## 2020-03-05 VITALS — BP 150/96 | HR 77 | Temp 97.6°F | Ht 73.0 in | Wt 261.0 lb

## 2020-03-05 DIAGNOSIS — E349 Endocrine disorder, unspecified: Secondary | ICD-10-CM

## 2020-03-05 DIAGNOSIS — I10 Essential (primary) hypertension: Secondary | ICD-10-CM | POA: Diagnosis not present

## 2020-03-05 MED ORDER — LOSARTAN POTASSIUM 50 MG PO TABS: 50.0000 mg | ORAL_TABLET | Freq: Every day | ORAL | 0 refills | Status: DC

## 2020-03-05 NOTE — Assessment & Plan Note (Signed)
Follows with Urology annually. Doing well on PRN sildenafil 100 mg (25 mg), continue same.

## 2020-03-05 NOTE — Progress Notes (Signed)
Subjective:    Patient ID: Daniel Sosa, male    DOB: 11/16/1980, 40 y.o.   MRN: [id number]  HPI  This visit occurred during the SARS-CoV-2 public health emergency.  Safety protocols were in place, including screening questions prior to the visit, additional usage of staff PPE, and extensive cleaning of exam room while observing appropriate contact time as indicated for disinfecting solutions.   Daniel Sosa is a 40 year old male who presents today to re-establish care and discuss the problems mentioned below. Will obtain/review records.  1) Elevated Blood Pressure: Has been following with [facility] every three months for the last year due to elevated blood pressure with readings averaging140's/90's. He is checking his blood pressure at home which is running mostly 140's/90's.   He denies dizziness, headaches, chest pain. He has a family history of hypertension in both maternal grandparents. He endorses a poor diet, eats at restaurants 3-4 times weekly (burgers, steaks), eats home cooked meals a few days weekly. Has increased intake of junk food.   He has never been treated for hypertension in the past.   2) Testosterone Deficiency: Follows with [facility]. Managed on Sildenafil 100 mg, cuts tablet in quarters, uses sparingly.  BP Readings from Last 3 Encounters:  03/05/20 (!) 150/96  07/02/15 126/78  05/20/15 120/80     Review of Systems  Eyes: Negative for visual disturbance.  Respiratory: Negative for shortness of breath.   Cardiovascular: Negative for chest pain.  Neurological: Negative for dizziness and headaches.       Past Medical History:  Diagnosis Date  . Elevated blood pressure   . Testosterone deficiency      Social History   Socioeconomic History  . Marital status: Legally Separated    Spouse name: Not on file  . Number of children: Not on file  . Years of education: Not on file  . Highest education level: Not on file  Occupational History  . Not on file   Tobacco Use  . Smoking status: Never Smoker  . Smokeless tobacco: Never Used  Substance and Sexual Activity  . Alcohol use: No    Alcohol/week: 0.0 standard drinks  . Drug use: No  . Sexual activity: Not on file  Other Topics Concern  . Not on file  Social History Narrative   Single.   Works for [employer] for 3 years.   Had 1 daughter, 20 year old.   Enjoys playing tennis, basketball.   Social Determinants of Health   Financial Resource Strain: Not on file  Food Insecurity: Not on file  Transportation Needs: Not on file  Physical Activity: Not on file  Stress: Not on file  Social Connections: Not on file  Intimate Partner Violence: Not on file    Past Surgical History:  Procedure Laterality Date  . BACK SURGERY    . HERNIA REPAIR     Age 73    Family History  Problem Relation Age of Onset  . Cancer Mother        Cervical   . Hypertension Mother   . Stroke Maternal Grandmother   . Heart failure Maternal Grandmother   . Diabetes Maternal Grandmother     No Known Allergies  No current outpatient medications on file prior to visit.   No current facility-administered medications on file prior to visit.    BP (!) 150/96 (BP Location: Left Arm, Patient Position: Sitting, Cuff Size: Normal)   Pulse 77   Temp 97.6 F (36.4 C) (Temporal)  Ht 6\' 1"  (1.854 m)   Wt 261 lb (118.4 kg)   SpO2 98%   BMI 34.43 kg/m    Objective:   Physical Exam Constitutional:      Appearance: He is well-nourished.  Cardiovascular:     Rate and Rhythm: Normal rate and regular rhythm.  Pulmonary:     Effort: Pulmonary effort is normal.     Breath sounds: Normal breath sounds.  Musculoskeletal:     Cervical back: Neck supple.  Skin:    General: Skin is warm and dry.  Psychiatric:        Mood and Affect: Mood and affect and mood normal.            Assessment & Plan:

## 2020-03-05 NOTE — Patient Instructions (Addendum)
Start losartan 50 mg once daily for blood pressure. Take either in the morning or evening.  Start exercising. You should be getting 150 minutes of moderate intensity exercise weekly.  It's important to improve your diet by reducing consumption of fast food, fried food, processed snack foods, sugary drinks. Increase consumption of fresh vegetables and fruits, whole grains, water.  Ensure you are drinking 64 ounces of water daily.  Please schedule a follow up visit to meet back with me in 2-3 weeks for blood pressure check.   It was a pleasure to see you today!    [email or web address].[email or web address]">  DASH Eating Plan DASH stands for Dietary Approaches to Stop Hypertension. The DASH eating plan is a healthy eating plan that has been shown to:  Reduce high blood pressure (hypertension).  Reduce your risk for type 2 diabetes, heart disease, and stroke.  Help with weight loss. What are tips for following this plan? Reading food labels  Check food labels for the amount of salt (sodium) per serving. Choose foods with less than 5 percent of the Daily Value of sodium. Generally, foods with less than 300 milligrams (mg) of sodium per serving fit into this eating plan.  To find whole grains, look for the word "whole" as the first word in the ingredient list. Shopping  Buy products labeled as "low-sodium" or "no salt added."  Buy fresh foods. Avoid canned foods and pre-made or frozen meals. Cooking  Avoid adding salt when cooking. Use salt-free seasonings or herbs instead of table salt or sea salt. Check with your health care provider or pharmacist before using salt substitutes.  Do not fry foods. Cook foods using healthy methods such as baking, boiling, grilling, roasting, and broiling instead.  Cook with heart-healthy oils, such as olive, canola, avocado, soybean, or sunflower oil. Meal planning  Eat a balanced diet that includes: ? 4 or more servings  of fruits and 4 or more servings of vegetables each day. Try to fill one-half of your plate with fruits and vegetables. ? 6-8 servings of whole grains each day. ? Less than 6 oz (170 g) of lean meat, poultry, or fish each day. A 3-oz (85-g) serving of meat is about the same size as a deck of cards. One egg equals 1 oz (28 g). ? 2-3 servings of low-fat dairy each day. One serving is 1 cup (237 mL). ? 1 serving of nuts, seeds, or beans 5 times each week. ? 2-3 servings of heart-healthy fats. Healthy fats called omega-3 fatty acids are found in foods such as walnuts, flaxseeds, fortified milks, and eggs. These fats are also found in cold-water fish, such as sardines, salmon, and mackerel.  Limit how much you eat of: ? Canned or prepackaged foods. ? Food that is high in trans fat, such as some fried foods. ? Food that is high in saturated fat, such as fatty meat. ? Desserts and other sweets, sugary drinks, and other foods with added sugar. ? Full-fat dairy products.  Do not salt foods before eating.  Do not eat more than 4 egg yolks a week.  Try to eat at least 2 vegetarian meals a week.  Eat more home-cooked food and less restaurant, buffet, and fast food.   Lifestyle  When eating at a restaurant, ask that your food be prepared with less salt or no salt, if possible.  If you drink alcohol: ? Limit how much you use to:  0-1 drink a day for women who are not pregnant.  0-2  drinks a day for men. ? Be aware of how much alcohol is in your drink. In the [place] one drink equals one 12 oz bottle of beer (355 mL), one 5 oz glass of wine (148 mL), or one 1 oz glass of hard liquor (44 mL). General information  Avoid eating more than 2,300 mg of salt a day. If you have hypertension, you may need to reduce your sodium intake to 1,500 mg a day.  Work with your health care provider to maintain a healthy body weight or to lose weight. Ask what an ideal weight is for you.  Get at least 30 minutes  of exercise that causes your heart to beat faster (aerobic exercise) most days of the week. Activities may include walking, swimming, or biking.  Work with your health care provider or dietitian to adjust your eating plan to your individual calorie needs. What foods should I eat? Fruits All fresh, dried, or frozen fruit. Canned fruit in natural juice (without added sugar). Vegetables Fresh or frozen vegetables (raw, steamed, roasted, or grilled). Low-sodium or reduced-sodium tomato and vegetable juice. Low-sodium or reduced-sodium tomato sauce and tomato paste. Low-sodium or reduced-sodium canned vegetables. Grains Whole-grain or whole-wheat bread. Whole-grain or whole-wheat pasta. Brown rice. Orpah Cobb. Bulgur. Whole-grain and low-sodium cereals. Pita bread. Low-fat, low-sodium crackers. Whole-wheat flour tortillas. Meats and other proteins Skinless chicken or [place]. Ground chicken or [place]. Pork with fat trimmed off. Fish and seafood. Egg whites. Dried beans, peas, or lentils. Unsalted nuts, nut butters, and seeds. Unsalted canned beans. Lean cuts of beef with fat trimmed off. Low-sodium, lean precooked or cured meat, such as sausages or meat loaves. Dairy Low-fat (1%) or fat-free (skim) milk. Reduced-fat, low-fat, or fat-free cheeses. Nonfat, low-sodium ricotta or cottage cheese. Low-fat or nonfat yogurt. Low-fat, low-sodium cheese. Fats and oils Soft margarine without trans fats. Vegetable oil. Reduced-fat, low-fat, or light mayonnaise and salad dressings (reduced-sodium). Canola, safflower, olive, avocado, soybean, and sunflower oils. Avocado. Seasonings and condiments Herbs. Spices. Seasoning mixes without salt. Other foods Unsalted popcorn and pretzels. Fat-free sweets. The items listed above may not be a complete list of foods and beverages you can eat. Contact a dietitian for more information. What foods should I avoid? Fruits Canned fruit in a light or heavy syrup. Fried  fruit. Fruit in cream or butter sauce. Vegetables Creamed or fried vegetables. Vegetables in a cheese sauce. Regular canned vegetables (not low-sodium or reduced-sodium). Regular canned tomato sauce and paste (not low-sodium or reduced-sodium). Regular tomato and vegetable juice (not low-sodium or reduced-sodium). Rosita Fire. Olives. Grains Baked goods made with fat, such as croissants, muffins, or some breads. Dry pasta or rice meal packs. Meats and other proteins Fatty cuts of meat. Ribs. Fried meat. Tomasa Blase. Bologna, salami, and other precooked or cured meats, such as sausages or meat loaves. Fat from the back of a pig (fatback). Bratwurst. Salted nuts and seeds. Canned beans with added salt. Canned or smoked fish. Whole eggs or egg yolks. Chicken or [place] with skin. Dairy Whole or 2% milk, cream, and half-and-half. Whole or full-fat cream cheese. Whole-fat or sweetened yogurt. Full-fat cheese. Nondairy creamers. Whipped toppings. Processed cheese and cheese spreads. Fats and oils Butter. Stick margarine. Lard. Shortening. Ghee. Bacon fat. Tropical oils, such as coconut, palm kernel, or palm oil. Seasonings and condiments Onion salt, garlic salt, seasoned salt, table salt, and sea salt. Worcestershire sauce. Tartar sauce. Barbecue sauce. Teriyaki sauce. Soy sauce, including reduced-sodium. Steak sauce. Canned and packaged gravies. Fish sauce. Oyster sauce.  Cocktail sauce. Store-bought horseradish. Ketchup. Mustard. Meat flavorings and tenderizers. Bouillon cubes. Hot sauces. Pre-made or packaged marinades. Pre-made or packaged taco seasonings. Relishes. Regular salad dressings. Other foods Salted popcorn and pretzels. The items listed above may not be a complete list of foods and beverages you should avoid. Contact a dietitian for more information. Where to find more information  National Heart, Lung, and Blood Institute: PopSteam.is  American Heart Association: www.heart.org  Academy of  Nutrition and Dietetics: www.eatright.org  National Kidney Foundation: www.kidney.org Summary  The DASH eating plan is a healthy eating plan that has been shown to reduce high blood pressure (hypertension). It may also reduce your risk for type 2 diabetes, heart disease, and stroke.  When on the DASH eating plan, aim to eat more fresh fruits and vegetables, whole grains, lean proteins, low-fat dairy, and heart-healthy fats.  With the DASH eating plan, you should limit salt (sodium) intake to 2,300 mg a day. If you have hypertension, you may need to reduce your sodium intake to 1,500 mg a day.  Work with your health care provider or dietitian to adjust your eating plan to your individual calorie needs. This information is not intended to replace advice given to you by your health care provider. Make sure you discuss any questions you have with your health care provider. Document Revised: 12/22/2018 Document Reviewed: 12/22/2018 Elsevier Patient Education  2021 [employer].

## 2020-03-05 NOTE — Assessment & Plan Note (Signed)
Chronically elevated over the last year, above goal today and on home readings.  Discussed the importance of a healthy diet and regular exercise in order for weight loss, and to reduce the risk of any potential medical problems.  Handout provided for DASH diet.   Rx for losartan 50 mg provided given long duration of elevated readings. He will work on weight loss in the mean time.  We will see him back in 2-3 weeks for BP check and BMP.

## 2020-03-26 ENCOUNTER — Other Ambulatory Visit: Payer: Self-pay

## 2020-03-26 ENCOUNTER — Encounter: Payer: Self-pay | Admitting: Primary Care

## 2020-03-26 ENCOUNTER — Ambulatory Visit (INDEPENDENT_AMBULATORY_CARE_PROVIDER_SITE_OTHER): Payer: Commercial Managed Care - PPO | Admitting: Primary Care

## 2020-03-26 DIAGNOSIS — I10 Essential (primary) hypertension: Secondary | ICD-10-CM

## 2020-03-26 LAB — COMPREHENSIVE METABOLIC PANEL
ALT: 17 U/L (ref 0–53)
AST: 17 U/L (ref 0–37)
Albumin: 4.7 g/dL (ref 3.5–5.2)
Alkaline Phosphatase: 62 U/L (ref 39–117)
BUN: 17 mg/dL (ref 6–23)
CO2: 29 mEq/L (ref 19–32)
Calcium: 9.4 mg/dL (ref 8.4–10.5)
Chloride: 105 mEq/L (ref 96–112)
Creatinine, Ser: 0.77 mg/dL (ref 0.40–1.50)
GFR: 114.12 mL/min (ref >60.00)
Glucose, Bld: 97 mg/dL (ref 70–99)
Potassium: 4.4 mEq/L (ref 3.5–5.1)
Sodium: 139 mEq/L (ref 135–145)
Total Bilirubin: 0.5 mg/dL (ref 0.2–1.2)
Total Protein: 7.6 g/dL (ref 6.0–8.3)

## 2020-03-26 MED ORDER — LOSARTAN POTASSIUM 50 MG PO TABS: 50.0000 mg | ORAL_TABLET | Freq: Every day | ORAL | 3 refills | Status: DC

## 2020-03-26 NOTE — Progress Notes (Signed)
Subjective:    Patient ID: Daniel Sosa, male    DOB: April 25, 1980, 40 y.o.   MRN: [id number]  HPI  This visit occurred during the SARS-CoV-2 public health emergency.  Safety protocols were in place, including screening questions prior to the visit, additional usage of staff PPE, and extensive cleaning of exam room while observing appropriate contact time as indicated for disinfecting solutions.   Daniel Sosa is a 40 year old male with a history of hypertension and testosterone deficiency who presents today for for follow up of hypertension.  He was last evaluated on 03/05/20 for re-establish care visit, BP had been consistently elevated over the prior year, also above goal during this visit and with home readings. Rx for losartan 50 mg sent to pharmacy for treatment, he is here today for follow up.  Since his last visit he's compliant to his losartan 50 mg. He's checking his BP at home which is running 130's/70's-80's. He's also improved his diet and is eating out less, is eating more salads and grilled lean protein.   BP Readings from Last 3 Encounters:  03/26/20 134/78  03/05/20 (!) 150/96  07/02/15 126/78     Review of Systems  Eyes: Negative for visual disturbance.  Respiratory: Negative for shortness of breath.   Cardiovascular: Negative for chest pain.  Neurological: Negative for dizziness and headaches.       Past Medical History:  Diagnosis Date  . Elevated blood pressure   . Testosterone deficiency      Social History   Socioeconomic History  . Marital status: Legally Separated    Spouse name: Not on file  . Number of children: Not on file  . Years of education: Not on file  . Highest education level: Not on file  Occupational History  . Not on file  Tobacco Use  . Smoking status: Never Smoker  . Smokeless tobacco: Never Used  Substance and Sexual Activity  . Alcohol use: No    Alcohol/week: 0.0 standard drinks  . Drug use: No  . Sexual activity: Not  on file  Other Topics Concern  . Not on file  Social History Narrative   Single.   Works for [employer] for 3 years.   Had 1 daughter, 51 year old.   Enjoys playing tennis, basketball.   Social Determinants of Health   Financial Resource Strain: Not on file  Food Insecurity: Not on file  Transportation Needs: Not on file  Physical Activity: Not on file  Stress: Not on file  Social Connections: Not on file  Intimate Partner Violence: Not on file    Past Surgical History:  Procedure Laterality Date  . BACK SURGERY    . HERNIA REPAIR     Age 44    Family History  Problem Relation Age of Onset  . Cancer Mother        Cervical   . Hypertension Mother   . Stroke Maternal Grandmother   . Heart failure Maternal Grandmother   . Diabetes Maternal Grandmother     No Known Allergies  Current Outpatient Medications on File Prior to Visit  Medication Sig Dispense Refill  . sildenafil (VIAGRA) 100 MG tablet Take 25 mg by mouth daily as needed.     No current facility-administered medications on file prior to visit.    BP 134/78   Pulse 61   Temp 98.6 F (37 C) (Temporal)   Ht 6\' 1"  (1.854 m)   Wt 264 lb (119.7 kg)  SpO2 98%   BMI 34.83 kg/m    Objective:   Physical Exam Constitutional:      Appearance: He is well-nourished.  Cardiovascular:     Rate and Rhythm: Normal rate and regular rhythm.  Pulmonary:     Effort: Pulmonary effort is normal.     Breath sounds: Normal breath sounds.  Musculoskeletal:     Cervical back: Neck supple.  Skin:    General: Skin is warm and dry.  Psychiatric:        Mood and Affect: Mood and affect normal.            Assessment & Plan:

## 2020-03-26 NOTE — Assessment & Plan Note (Addendum)
Improved on losartan 50 mg, has also improved his diet, commended him on this!  Continue losartan 50 mg daily. Continue to work on weight loss from diet.  CMP pending.

## 2020-03-26 NOTE — Patient Instructions (Signed)
Stop by the lab prior to leaving today. I will notify you of your results once received.   Continue to work on an improved diet.  Ensure you are consuming 64 ounces of water daily.  Continue taking losartan 50 mg once daily for blood pressure.   It was a pleasure to see you today!   [email or web address].mata.com/.pdf">  DASH Eating Plan DASH stands for Dietary Approaches to Stop Hypertension. The DASH eating plan is a healthy eating plan that has been shown to:  Reduce high blood pressure (hypertension).  Reduce your risk for type 2 diabetes, heart disease, and stroke.  Help with weight loss. What are tips for following this plan? Reading food labels  Check food labels for the amount of salt (sodium) per serving. Choose foods with less than 5 percent of the Daily Value of sodium. Generally, foods with less than 300 milligrams (mg) of sodium per serving fit into this eating plan.  To find whole grains, look for the word "whole" as the first word in the ingredient list. Shopping  Buy products labeled as "low-sodium" or "no salt added."  Buy fresh foods. Avoid canned foods and pre-made or frozen meals. Cooking  Avoid adding salt when cooking. Use salt-free seasonings or herbs instead of table salt or sea salt. Check with your health care provider or pharmacist before using salt substitutes.  Do not fry foods. Cook foods using healthy methods such as baking, boiling, grilling, roasting, and broiling instead.  Cook with heart-healthy oils, such as olive, canola, avocado, soybean, or sunflower oil. Meal planning  Eat a balanced diet that includes: ? 4 or more servings of fruits and 4 or more servings of vegetables each day. Try to fill one-half of your plate with fruits and vegetables. ? 6-8 servings of whole grains each day. ? Less than 6 oz (170 g) of lean meat, poultry, or fish each day. A 3-oz (85-g) serving of meat is about the same size as a  deck of cards. One egg equals 1 oz (28 g). ? 2-3 servings of low-fat dairy each day. One serving is 1 cup (237 mL). ? 1 serving of nuts, seeds, or beans 5 times each week. ? 2-3 servings of heart-healthy fats. Healthy fats called omega-3 fatty acids are found in foods such as walnuts, flaxseeds, fortified milks, and eggs. These fats are also found in cold-water fish, such as sardines, salmon, and mackerel.  Limit how much you eat of: ? Canned or prepackaged foods. ? Food that is high in trans fat, such as some fried foods. ? Food that is high in saturated fat, such as fatty meat. ? Desserts and other sweets, sugary drinks, and other foods with added sugar. ? Full-fat dairy products.  Do not salt foods before eating.  Do not eat more than 4 egg yolks a week.  Try to eat at least 2 vegetarian meals a week.  Eat more home-cooked food and less restaurant, buffet, and fast food.   Lifestyle  When eating at a restaurant, ask that your food be prepared with less salt or no salt, if possible.  If you drink alcohol: ? Limit how much you use to:  0-1 drink a day for women who are not pregnant.  0-2 drinks a day for men. ? Be aware of how much alcohol is in your drink. In the [place] one drink equals one 12 oz bottle of beer (355 mL), one 5 oz glass of wine (148 mL), or one 1 oz glass  of hard liquor (44 mL). General information  Avoid eating more than 2,300 mg of salt a day. If you have hypertension, you may need to reduce your sodium intake to 1,500 mg a day.  Work with your health care provider to maintain a healthy body weight or to lose weight. Ask what an ideal weight is for you.  Get at least 30 minutes of exercise that causes your heart to beat faster (aerobic exercise) most days of the week. Activities may include walking, swimming, or biking.  Work with your health care provider or dietitian to adjust your eating plan to your individual calorie needs. What foods should I  eat? Fruits All fresh, dried, or frozen fruit. Canned fruit in natural juice (without added sugar). Vegetables Fresh or frozen vegetables (raw, steamed, roasted, or grilled). Low-sodium or reduced-sodium tomato and vegetable juice. Low-sodium or reduced-sodium tomato sauce and tomato paste. Low-sodium or reduced-sodium canned vegetables. Grains Whole-grain or whole-wheat bread. Whole-grain or whole-wheat pasta. Brown rice. Orpah Cobb. Bulgur. Whole-grain and low-sodium cereals. Pita bread. Low-fat, low-sodium crackers. Whole-wheat flour tortillas. Meats and other proteins Skinless chicken or [place]. Ground chicken or [place]. Pork with fat trimmed off. Fish and seafood. Egg whites. Dried beans, peas, or lentils. Unsalted nuts, nut butters, and seeds. Unsalted canned beans. Lean cuts of beef with fat trimmed off. Low-sodium, lean precooked or cured meat, such as sausages or meat loaves. Dairy Low-fat (1%) or fat-free (skim) milk. Reduced-fat, low-fat, or fat-free cheeses. Nonfat, low-sodium ricotta or cottage cheese. Low-fat or nonfat yogurt. Low-fat, low-sodium cheese. Fats and oils Soft margarine without trans fats. Vegetable oil. Reduced-fat, low-fat, or light mayonnaise and salad dressings (reduced-sodium). Canola, safflower, olive, avocado, soybean, and sunflower oils. Avocado. Seasonings and condiments Herbs. Spices. Seasoning mixes without salt. Other foods Unsalted popcorn and pretzels. Fat-free sweets. The items listed above may not be a complete list of foods and beverages you can eat. Contact a dietitian for more information. What foods should I avoid? Fruits Canned fruit in a light or heavy syrup. Fried fruit. Fruit in cream or butter sauce. Vegetables Creamed or fried vegetables. Vegetables in a cheese sauce. Regular canned vegetables (not low-sodium or reduced-sodium). Regular canned tomato sauce and paste (not low-sodium or reduced-sodium). Regular tomato and vegetable juice  (not low-sodium or reduced-sodium). Rosita Fire. Olives. Grains Baked goods made with fat, such as croissants, muffins, or some breads. Dry pasta or rice meal packs. Meats and other proteins Fatty cuts of meat. Ribs. Fried meat. Tomasa Blase. Bologna, salami, and other precooked or cured meats, such as sausages or meat loaves. Fat from the back of a pig (fatback). Bratwurst. Salted nuts and seeds. Canned beans with added salt. Canned or smoked fish. Whole eggs or egg yolks. Chicken or [place] with skin. Dairy Whole or 2% milk, cream, and half-and-half. Whole or full-fat cream cheese. Whole-fat or sweetened yogurt. Full-fat cheese. Nondairy creamers. Whipped toppings. Processed cheese and cheese spreads. Fats and oils Butter. Stick margarine. Lard. Shortening. Ghee. Bacon fat. Tropical oils, such as coconut, palm kernel, or palm oil. Seasonings and condiments Onion salt, garlic salt, seasoned salt, table salt, and sea salt. Worcestershire sauce. Tartar sauce. Barbecue sauce. Teriyaki sauce. Soy sauce, including reduced-sodium. Steak sauce. Canned and packaged gravies. Fish sauce. Oyster sauce. Cocktail sauce. Store-bought horseradish. Ketchup. Mustard. Meat flavorings and tenderizers. Bouillon cubes. Hot sauces. Pre-made or packaged marinades. Pre-made or packaged taco seasonings. Relishes. Regular salad dressings. Other foods Salted popcorn and pretzels. The items listed above may not be a complete list  of foods and beverages you should avoid. Contact a dietitian for more information. Where to find more information  National Heart, Lung, and Blood Institute: PopSteam.is  American Heart Association: www.heart.org  Academy of Nutrition and Dietetics: www.eatright.org  National Kidney Foundation: www.kidney.org Summary  The DASH eating plan is a healthy eating plan that has been shown to reduce high blood pressure (hypertension). It may also reduce your risk for type 2 diabetes, heart disease, and  stroke.  When on the DASH eating plan, aim to eat more fresh fruits and vegetables, whole grains, lean proteins, low-fat dairy, and heart-healthy fats.  With the DASH eating plan, you should limit salt (sodium) intake to 2,300 mg a day. If you have hypertension, you may need to reduce your sodium intake to 1,500 mg a day.  Work with your health care provider or dietitian to adjust your eating plan to your individual calorie needs. This information is not intended to replace advice given to you by your health care provider. Make sure you discuss any questions you have with your health care provider. Document Revised: 12/22/2018 Document Reviewed: 12/22/2018 Elsevier Patient Education  2021 [employer].

## 2020-04-01 NOTE — Telephone Encounter (Signed)
Patient needs office visit, we can work in somewhere. He could be experiencing a kidney stone which produced those same symptoms.

## 2020-04-02 NOTE — Telephone Encounter (Signed)
Noted and glad to hear! 

## 2020-09-18 DIAGNOSIS — E349 Endocrine disorder, unspecified: Secondary | ICD-10-CM

## 2020-09-19 NOTE — Telephone Encounter (Signed)
Message sent to patient that you are out of the office and will address no later then 8/26.

## 2020-09-22 MED ORDER — SILDENAFIL CITRATE 100 MG PO TABS: ORAL_TABLET | ORAL | 0 refills | Status: DC

## 2021-03-23 ENCOUNTER — Encounter: Payer: Commercial Managed Care - PPO | Admitting: Primary Care

## 2021-03-27 ENCOUNTER — Encounter: Payer: Commercial Managed Care - PPO | Admitting: Primary Care

## 2021-04-07 ENCOUNTER — Other Ambulatory Visit: Payer: Self-pay | Admitting: Primary Care

## 2021-04-07 DIAGNOSIS — I10 Essential (primary) hypertension: Secondary | ICD-10-CM

## 2021-04-24 ENCOUNTER — Encounter: Payer: Self-pay | Admitting: Primary Care

## 2021-04-24 ENCOUNTER — Ambulatory Visit (INDEPENDENT_AMBULATORY_CARE_PROVIDER_SITE_OTHER): Payer: Commercial Managed Care - PPO | Admitting: Primary Care

## 2021-04-24 ENCOUNTER — Other Ambulatory Visit: Payer: Self-pay

## 2021-04-24 VITALS — BP 128/82 | HR 72 | Ht 73.0 in | Wt 274.6 lb

## 2021-04-24 DIAGNOSIS — I10 Essential (primary) hypertension: Secondary | ICD-10-CM

## 2021-04-24 DIAGNOSIS — E349 Endocrine disorder, unspecified: Secondary | ICD-10-CM | POA: Diagnosis not present

## 2021-04-24 DIAGNOSIS — Z23 Encounter for immunization: Secondary | ICD-10-CM | POA: Diagnosis not present

## 2021-04-24 DIAGNOSIS — Z0000 Encounter for general adult medical examination without abnormal findings: Secondary | ICD-10-CM

## 2021-04-24 MED ORDER — LOSARTAN POTASSIUM 50 MG PO TABS: 50.0000 mg | ORAL_TABLET | Freq: Every day | ORAL | 3 refills | Status: DC

## 2021-04-24 MED ORDER — SILDENAFIL CITRATE 100 MG PO TABS: ORAL_TABLET | ORAL | 0 refills | Status: DC

## 2021-04-24 NOTE — Assessment & Plan Note (Signed)
Tetanus due, provided today. ? ?Discussed the importance of a healthy diet and regular exercise in order for weight loss, and to reduce the risk of further co-morbidity. ? ?Exam today stable. ?Labs pending. ?

## 2021-04-24 NOTE — Assessment & Plan Note (Signed)
Stable. ? ?Continue sildenafil 25 milligrams as needed. ?Refills provided today. ?

## 2021-04-24 NOTE — Addendum Note (Signed)
Addended by: Winn Jock on: 04/24/2021 03:14 PM ? ? Modules accepted: Orders ? ?

## 2021-04-24 NOTE — Progress Notes (Signed)
? ?Subjective:  ? ? Patient ID: Daniel Sosa, male    DOB: 17-Mar-1980, 41 y.o.   MRN: [id number] ? ?HPI ? ?Daniel Sosa is a very pleasant 41 y.o. male who presents today for complete physical and follow up of chronic conditions. ? ?Immunizations: ?-Tetanus: Unsure, provided today. ?-Influenza: Did not complete last season  ?-Covid-19: 2 vaccines ? ?Diet: Fair diet.  ?Exercise: No regular exercise. Active at work. ? ?Eye exam: Completes annually  ?Dental exam: Completes semi-annually  ? ?BP Readings from Last 3 Encounters:  ?04/24/21 128/82  ?03/26/20 134/78  ?03/05/20 (!) 150/96  ? ? ? ? ? ? ?Review of Systems  ?Constitutional:  Negative for unexpected weight change.  ?HENT:  Negative for rhinorrhea.   ?Respiratory:  Negative for cough and shortness of breath.   ?Cardiovascular:  Negative for chest pain.  ?Gastrointestinal:  Negative for constipation and diarrhea.  ?Genitourinary:  Negative for difficulty urinating.  ?Musculoskeletal:  Negative for arthralgias and myalgias.  ?Skin:  Negative for rash.  ?Allergic/Immunologic: Negative for environmental allergies.  ?Neurological:  Negative for dizziness and headaches.  ?Psychiatric/Behavioral:  The patient is not nervous/anxious.   ? ?   ? ? ?Past Medical History:  ?Diagnosis Date  ? Elevated blood pressure   ? Testosterone deficiency   ? ? ?Social History  ? ?Socioeconomic History  ? Marital status: Married  ?  Spouse name: Not on file  ? Number of children: Not on file  ? Years of education: Not on file  ? Highest education level: Not on file  ?Occupational History  ? Not on file  ?Tobacco Use  ? Smoking status: Never  ? Smokeless tobacco: Never  ?Substance and Sexual Activity  ? Alcohol use: No  ?  Alcohol/week: 0.0 standard drinks  ? Drug use: No  ? Sexual activity: Not on file  ?Other Topics Concern  ? Not on file  ?Social History Narrative  ? Single.  ? Works for [employer] for 3 years.  ? Had 1 daughter, 17 year old.  ? Enjoys playing tennis,  basketball.  ? ?Social Determinants of Health  ? ?Financial Resource Strain: Not on file  ?Food Insecurity: Not on file  ?Transportation Needs: Not on file  ?Physical Activity: Not on file  ?Stress: Not on file  ?Social Connections: Not on file  ?Intimate Partner Violence: Not on file  ? ? ?Past Surgical History:  ?Procedure Laterality Date  ? BACK SURGERY    ? HERNIA REPAIR    ? Age 55  ? ? ?Family History  ?Problem Relation Age of Onset  ? Cancer Mother   ?     Cervical   ? Hypertension Mother   ? Stroke Maternal Grandmother   ? Heart failure Maternal Grandmother   ? Diabetes Maternal Grandmother   ? ? ?No Known Allergies ? ?Current Outpatient Medications on File Prior to Visit  ?Medication Sig Dispense Refill  ? losartan (COZAAR) 50 MG tablet Take 1 tablet (50 mg total) by mouth daily. For blood pressure. Office visit required for further refills. 30 tablet 0  ? sildenafil (VIAGRA) 100 MG tablet Take 1/4th tablet by mouth 30-60 minutes prior to intercourse as needed. 30 tablet 0  ? ?No current facility-administered medications on file prior to visit.  ? ? ?BP 128/82   Pulse 72   Ht 6\' 1"  (1.854 m)   Wt 274 lb 9.6 oz (124.6 kg)   SpO2 98%   BMI 36.23 kg/m?  ?Objective:  ?  Physical Exam ?HENT:  ?   Right Ear: Tympanic membrane and ear canal normal.  ?   Left Ear: Tympanic membrane and ear canal normal.  ?   Nose: Nose normal.  ?   Right Sinus: No maxillary sinus tenderness or frontal sinus tenderness.  ?   Left Sinus: No maxillary sinus tenderness or frontal sinus tenderness.  ?Eyes:  ?   Conjunctiva/sclera: Conjunctivae normal.  ?Neck:  ?   Thyroid: No thyromegaly.  ?   Vascular: No carotid bruit.  ?Cardiovascular:  ?   Rate and Rhythm: Normal rate and regular rhythm.  ?   Heart sounds: Normal heart sounds.  ?Pulmonary:  ?   Effort: Pulmonary effort is normal.  ?   Breath sounds: Normal breath sounds. No wheezing or rales.  ?Abdominal:  ?   General: Bowel sounds are normal.  ?   Palpations: Abdomen is soft.   ?   Tenderness: There is no abdominal tenderness.  ?Musculoskeletal:     ?   General: Normal range of motion.  ?   Cervical back: Neck supple.  ?Skin: ?   General: Skin is warm and dry.  ?Neurological:  ?   Mental Status: He is alert and oriented to person, place, and time.  ?   Cranial Nerves: No cranial nerve deficit.  ?   Deep Tendon Reflexes: Reflexes are normal and symmetric.  ?Psychiatric:     ?   Mood and Affect: Mood normal.  ? ? ? ? ? ?   ?Assessment & Plan:  ? ? ? ? ?This visit occurred during the SARS-CoV-2 public health emergency.  Safety protocols were in place, including screening questions prior to the visit, additional usage of staff PPE, and extensive cleaning of exam room while observing appropriate contact time as indicated for disinfecting solutions.  ?

## 2021-04-24 NOTE — Patient Instructions (Signed)
Stop by the lab prior to leaving today. I will notify you of your results once received.  ° °It was a pleasure to see you today! ° °Preventive Care 21-39 Years Old, Male °Preventive care refers to lifestyle choices and visits with your health care provider that can promote health and wellness. Preventive care visits are also called wellness exams. °What can I expect for my preventive care visit? °Counseling °During your preventive care visit, your health care provider may ask about your: °Medical history, including: °Past medical problems. °Family medical history. °Current health, including: °Emotional well-being. °Home life and relationship well-being. °Sexual activity. °Lifestyle, including: °Alcohol, nicotine or tobacco, and drug use. °Access to firearms. °Diet, exercise, and sleep habits. °Safety issues such as seatbelt and bike helmet use. °Sunscreen use. °Work and work environment. °Physical exam °Your health care provider may check your: °Height and weight. These may be used to calculate your BMI (body mass index). BMI is a measurement that tells if you are at a healthy weight. °Waist circumference. This measures the distance around your waistline. This measurement also tells if you are at a healthy weight and may help predict your risk of certain diseases, such as type 2 diabetes and high blood pressure. °Heart rate and blood pressure. °Body temperature. °Skin for abnormal spots. °What immunizations do I need? °Vaccines are usually given at various ages, according to a schedule. Your health care provider will recommend vaccines for you based on your age, medical history, and lifestyle or other factors, such as travel or where you work. °What tests do I need? °Screening °Your health care provider may recommend screening tests for certain conditions. This may include: °Lipid and cholesterol levels. °Diabetes screening. This is done by checking your blood sugar (glucose) after you have not eaten for a while  (fasting). °Hepatitis B test. °Hepatitis C test. °HIV (human immunodeficiency virus) test. °STI (sexually transmitted infection) testing, if you are at risk. °Talk with your health care provider about your test results, treatment options, and if necessary, the need for more tests. °Follow these instructions at home: °Eating and drinking ° °Eat a healthy diet that includes fresh fruits and vegetables, whole grains, lean protein, and low-fat dairy products. °Drink enough fluid to keep your urine pale yellow. °Take vitamin and mineral supplements as recommended by your health care provider. °Do not drink alcohol if your health care provider tells you not to drink. °If you drink alcohol: °Limit how much you have to 0-2 drinks a day. °Know how much alcohol is in your drink. In the [place] one drink equals one 12 oz bottle of beer (355 mL), one 5 oz glass of wine (148 mL), or one 1½ oz glass of hard liquor (44 mL). °Lifestyle °Brush your teeth every morning and night with fluoride toothpaste. Floss one time each day. °Exercise for at least 30 minutes 5 or more days each week. °Do not use any products that contain nicotine or tobacco. These products include cigarettes, chewing tobacco, and vaping devices, such as e-cigarettes. If you need help quitting, ask your health care provider. °Do not use drugs. °If you are sexually active, practice safe sex. Use a condom or other form of protection to prevent STIs. °Find healthy ways to manage stress, such as: °Meditation, yoga, or listening to music. °Journaling. °Talking to a trusted person. °Spending time with friends and family. °Minimize exposure to UV radiation to reduce your risk of skin cancer. °Safety °Always wear your seat belt while driving or riding in a   vehicle. °Do not drive: °If you have been drinking alcohol. Do not ride with someone who has been drinking. °If you have been using any mind-altering substances or drugs. °While texting. °When you are tired or  distracted. °Wear a helmet and other protective equipment during sports activities. °If you have firearms in your house, make sure you follow all gun safety procedures. °Seek help if you have been physically or sexually abused. °What's next? °Go to your health care provider once a year for an annual wellness visit. °Ask your health care provider how often you should have your eyes and teeth checked. °Stay up to date on all vaccines. °This information is not intended to replace advice given to you by your health care provider. Make sure you discuss any questions you have with your health care provider. °Document Revised: 07/16/2020 Document Reviewed: 07/16/2020 °Elsevier Patient Education © 2022 Elsevier Inc. ° °

## 2021-04-24 NOTE — Assessment & Plan Note (Signed)
Controlled.   Continue losartan 50 mg daily. CMP pending. 

## 2021-04-25 LAB — COMPREHENSIVE METABOLIC PANEL
AG Ratio: 2 (calc) (ref 1.0–2.5)
ALT: 30 U/L (ref 9–46)
AST: 21 U/L (ref 10–40)
Albumin: 4.9 g/dL (ref 3.6–5.1)
Alkaline phosphatase (APISO): 68 U/L (ref 36–130)
BUN: 15 mg/dL (ref 7–25)
CO2: 25 mmol/L (ref 20–32)
Calcium: 9.8 mg/dL (ref 8.6–10.3)
Chloride: 104 mmol/L (ref 98–110)
Creat: 0.78 mg/dL (ref 0.60–1.26)
Globulin: 2.4 g/dL (calc) (ref 1.9–3.7)
Glucose, Bld: 88 mg/dL (ref 65–99)
Potassium: 4.2 mmol/L (ref 3.5–5.3)
Sodium: 140 mmol/L (ref 135–146)
Total Bilirubin: 0.5 mg/dL (ref 0.2–1.2)
Total Protein: 7.3 g/dL (ref 6.1–8.1)

## 2021-04-25 LAB — LIPID PANEL
Cholesterol: 219 mg/dL — ABNORMAL HIGH (ref <200)
HDL: 37 mg/dL — ABNORMAL LOW (ref >=40)
LDL Cholesterol (Calc): 148 mg/dL (calc) — ABNORMAL HIGH
Non-HDL Cholesterol (Calc): 182 mg/dL (calc) — ABNORMAL HIGH (ref <130)
Total CHOL/HDL Ratio: 5.9 (calc) — ABNORMAL HIGH (ref <5.0)
Triglycerides: 196 mg/dL — ABNORMAL HIGH (ref <150)

## 2022-01-04 ENCOUNTER — Other Ambulatory Visit: Payer: Self-pay | Admitting: Primary Care

## 2022-01-04 DIAGNOSIS — E349 Endocrine disorder, unspecified: Secondary | ICD-10-CM

## 2022-01-04 MED ORDER — SILDENAFIL CITRATE 100 MG PO TABS: ORAL_TABLET | ORAL | 0 refills | Status: DC

## 2022-01-04 NOTE — Telephone Encounter (Signed)
From: Anselm Jungling To: Office of [name], NP Sent: 01/01/2022 1:59 PM EST Subject: Medication Renewal Request  Refills have been requested for the following medications:   sildenafil (VIAGRA) 100 MG tablet [Obi Scrima K Merric Yost]  Patient Comment: Can you send to [address] so I can get goodrx discount   Preferred pharmacy: [id number] - [place], [place] - [address] Delivery method: Daryll Drown

## 2022-05-15 ENCOUNTER — Other Ambulatory Visit: Payer: Self-pay | Admitting: Primary Care

## 2022-05-15 DIAGNOSIS — I10 Essential (primary) hypertension: Secondary | ICD-10-CM

## 2022-05-16 NOTE — Telephone Encounter (Signed)
Patient is due for CPE/follow up, this will be required prior to any further refills.  Please schedule, thank you!   

## 2022-05-17 NOTE — Telephone Encounter (Signed)
Sched 5/1 for CPE

## 2022-06-02 ENCOUNTER — Encounter: Payer: Commercial Managed Care - PPO | Admitting: Primary Care

## 2022-06-09 ENCOUNTER — Ambulatory Visit (INDEPENDENT_AMBULATORY_CARE_PROVIDER_SITE_OTHER): Payer: Commercial Managed Care - PPO | Admitting: Primary Care

## 2022-06-09 ENCOUNTER — Encounter: Payer: Self-pay | Admitting: Primary Care

## 2022-06-09 VITALS — BP 136/80 | HR 76 | Temp 100.6°F | Ht 73.0 in | Wt 273.0 lb

## 2022-06-09 DIAGNOSIS — Z1159 Encounter for screening for other viral diseases: Secondary | ICD-10-CM | POA: Diagnosis not present

## 2022-06-09 DIAGNOSIS — E349 Endocrine disorder, unspecified: Secondary | ICD-10-CM | POA: Diagnosis not present

## 2022-06-09 DIAGNOSIS — Z0000 Encounter for general adult medical examination without abnormal findings: Secondary | ICD-10-CM

## 2022-06-09 DIAGNOSIS — Z114 Encounter for screening for human immunodeficiency virus [HIV]: Secondary | ICD-10-CM | POA: Diagnosis not present

## 2022-06-09 DIAGNOSIS — I10 Essential (primary) hypertension: Secondary | ICD-10-CM

## 2022-06-09 NOTE — Patient Instructions (Signed)
Stop by the lab prior to leaving today. I will notify you of your results once received.   It was a pleasure to see you today!  

## 2022-06-09 NOTE — Assessment & Plan Note (Signed)
Stable. Continue sildenafil 25mg PRN.

## 2022-06-09 NOTE — Assessment & Plan Note (Signed)
Immunizations UTD. Colonoscopy UTD, due 2033  Discussed the importance of a healthy diet and regular exercise in order for weight loss, and to reduce the risk of further co-morbidity.  Exam stable. Labs pending.  Follow up in 1 year for repeat physical.

## 2022-06-09 NOTE — Progress Notes (Signed)
Subjective:    Patient ID: Daniel Sosa, male    DOB: 08-Feb-1980, 42 y.o.   MRN: [id number]  HPI  Daniel Sosa is a very pleasant 42 y.o. male who presents today for complete physical and follow up of chronic conditions.  Immunizations: -Tetanus: Completed in 2023  Diet: Fair diet.  Exercise: No regular exercise.   Eye exam: Completed 2 years ago  Dental exam: Completes semi-annually     BP Readings from Last 3 Encounters:  06/09/22 136/80  04/24/21 128/82  03/26/20 134/78   Wt Readings from Last 3 Encounters:  06/09/22 273 lb (123.8 kg)  04/24/21 274 lb 9.6 oz (124.6 kg)  03/26/20 264 lb (119.7 kg)   He is checking his BP at home which is ranging 130-140's/80's.    Review of Systems  Constitutional:  Negative for unexpected weight change.  HENT:  Negative for rhinorrhea.   Respiratory:  Negative for cough and shortness of breath.   Cardiovascular:  Negative for chest pain.  Gastrointestinal:  Negative for constipation and diarrhea.  Genitourinary:  Negative for difficulty urinating.  Musculoskeletal:  Negative for arthralgias and myalgias.  Skin:  Negative for rash.  Allergic/Immunologic: Negative for environmental allergies.  Neurological:  Negative for dizziness and headaches.  Psychiatric/Behavioral:  The patient is not nervous/anxious.          Past Medical History:  Diagnosis Date   Elevated blood pressure    Testosterone deficiency     Social History   Socioeconomic History   Marital status: Married    Spouse name: Not on file   Number of children: Not on file   Years of education: Not on file   Highest education level: Not on file  Occupational History   Not on file  Tobacco Use   Smoking status: Never   Smokeless tobacco: Never  Substance and Sexual Activity   Alcohol use: No    Alcohol/week: 0.0 standard drinks of alcohol   Drug use: No   Sexual activity: Not on file  Other Topics Concern   Not on file  Social History  Narrative   Single.   Works for [employer] for 3 years.   Had 1 daughter, 57 year old.   Enjoys playing tennis, basketball.   Social Determinants of Health   Financial Resource Strain: Not on file  Food Insecurity: Not on file  Transportation Needs: Not on file  Physical Activity: Not on file  Stress: Not on file  Social Connections: Not on file  Intimate Partner Violence: Not on file    Past Surgical History:  Procedure Laterality Date   BACK SURGERY     HERNIA REPAIR     Age 22    Family History  Problem Relation Age of Onset   Cancer Mother        Cervical    Hypertension Mother    Stroke Maternal Grandmother    Heart failure Maternal Grandmother    Diabetes Maternal Grandmother     No Known Allergies  Current Outpatient Medications on File Prior to Visit  Medication Sig Dispense Refill   losartan (COZAAR) 50 MG tablet TAKE 1 TABLET (50 MG TOTAL) BY MOUTH DAILY. FOR BLOOD PRESSURE. 30 tablet 0   sildenafil (VIAGRA) 100 MG tablet Take 1/4th tablet by mouth 30-60 minutes prior to intercourse as needed. 30 tablet 0   No current facility-administered medications on file prior to visit.    BP 136/80   Pulse 76   Temp (!)  100.6 F (38.1 C) (Temporal)   Ht 6\' 1"  (1.854 m)   Wt 273 lb (123.8 kg)   SpO2 98%   BMI 36.02 kg/m  Objective:   Physical Exam HENT:     Right Ear: Tympanic membrane and ear canal normal.     Left Ear: Tympanic membrane and ear canal normal.     Nose: Nose normal.     Right Sinus: No maxillary sinus tenderness or frontal sinus tenderness.     Left Sinus: No maxillary sinus tenderness or frontal sinus tenderness.  Eyes:     Conjunctiva/sclera: Conjunctivae normal.  Neck:     Thyroid: No thyromegaly.     Vascular: No carotid bruit.  Cardiovascular:     Rate and Rhythm: Normal rate and regular rhythm.     Heart sounds: Normal heart sounds.  Pulmonary:     Effort: Pulmonary effort is normal.     Breath sounds: Normal breath sounds. No  wheezing or rales.  Abdominal:     General: Bowel sounds are normal.     Palpations: Abdomen is soft.     Tenderness: There is no abdominal tenderness.  Musculoskeletal:        General: Normal range of motion.     Cervical back: Neck supple.  Skin:    General: Skin is warm and dry.  Neurological:     Mental Status: He is alert and oriented to person, place, and time.     Cranial Nerves: No cranial nerve deficit.     Deep Tendon Reflexes: Reflexes are normal and symmetric.  Psychiatric:        Mood and Affect: Mood normal.           Assessment & Plan:  Preventative health care Assessment & Plan: Immunizations UTD. Colonoscopy UTD, due 2033  Discussed the importance of a healthy diet and regular exercise in order for weight loss, and to reduce the risk of further co-morbidity.  Exam stable. Labs pending.  Follow up in 1 year for repeat physical.   Orders: -     Lipid panel -     Comprehensive metabolic panel -     Hemoglobin A1c  Testosterone deficiency Assessment & Plan: Stable.  Continue sildenafil 25 mg PRN.   Encounter for hepatitis C screening test for low risk patient -     Hepatitis C antibody  Screening for HIV (human immunodeficiency virus) -     HIV Antibody (routine testing w rflx)  Essential hypertension Assessment & Plan: Borderline high, would like to see him lower. He will work on lifestyle changes.  Continue losartan 50 mg daily for now. CMP pending.         [name], NP

## 2022-06-09 NOTE — Assessment & Plan Note (Signed)
Borderline high, would like to see him lower. He will work on lifestyle changes.  Continue losartan 50 mg daily for now. CMP pending.

## 2022-06-10 LAB — COMPREHENSIVE METABOLIC PANEL
ALT: 33 U/L (ref 0–53)
AST: 24 U/L (ref 0–37)
Albumin: 4.8 g/dL (ref 3.5–5.2)
Alkaline Phosphatase: 66 U/L (ref 39–117)
BUN: 15 mg/dL (ref 6–23)
CO2: 27 mEq/L (ref 19–32)
Calcium: 9.7 mg/dL (ref 8.4–10.5)
Chloride: 102 mEq/L (ref 96–112)
Creatinine, Ser: 0.84 mg/dL (ref 0.40–1.50)
GFR: 109.45 mL/min (ref >60.00)
Glucose, Bld: 80 mg/dL (ref 70–99)
Potassium: 4 mEq/L (ref 3.5–5.1)
Sodium: 139 mEq/L (ref 135–145)
Total Bilirubin: 0.5 mg/dL (ref 0.2–1.2)
Total Protein: 7.3 g/dL (ref 6.0–8.3)

## 2022-06-10 LAB — LIPID PANEL
Cholesterol: 224 mg/dL — ABNORMAL HIGH (ref 0–200)
HDL: 40.7 mg/dL (ref >39.00)
NonHDL: 183.67
Total CHOL/HDL Ratio: 6
Triglycerides: 203 mg/dL — ABNORMAL HIGH (ref 0.0–149.0)
VLDL: 40.6 mg/dL — ABNORMAL HIGH (ref 0.0–40.0)

## 2022-06-10 LAB — LDL CHOLESTEROL, DIRECT: Direct LDL: 160 mg/dL

## 2022-06-10 LAB — HIV ANTIBODY (ROUTINE TESTING W REFLEX): HIV 1&2 Ab, 4th Generation: NONREACTIVE

## 2022-06-10 LAB — HEPATITIS C ANTIBODY: Hepatitis C Ab: NONREACTIVE

## 2022-06-10 LAB — HEMOGLOBIN A1C: Hgb A1c MFr Bld: 5.6 % (ref 4.6–6.5)

## 2022-06-16 ENCOUNTER — Other Ambulatory Visit: Payer: Self-pay | Admitting: Primary Care

## 2022-06-16 DIAGNOSIS — I10 Essential (primary) hypertension: Secondary | ICD-10-CM

## 2022-07-27 ENCOUNTER — Other Ambulatory Visit: Payer: Self-pay | Admitting: Primary Care

## 2022-07-27 DIAGNOSIS — E349 Endocrine disorder, unspecified: Secondary | ICD-10-CM

## 2022-07-27 MED ORDER — SILDENAFIL CITRATE 100 MG PO TABS
ORAL_TABLET | ORAL | 0 refills | Status: DC
Start: 2022-07-27 — End: 2022-12-17

## 2022-07-27 NOTE — Telephone Encounter (Signed)
From: Anselm Jungling To: Office of [name], NP Sent: 07/27/2022 12:21 PM EDT Subject: Medication Renewal Request  Refills have been requested for the following medications:   sildenafil (VIAGRA) 100 MG tablet [Kirsi Hugh K Lee-Anne Flicker]  Preferred pharmacy: [facility] [address], [place] - [address] Delivery method: Pickup

## 2022-12-17 ENCOUNTER — Other Ambulatory Visit: Payer: Self-pay | Admitting: Primary Care

## 2022-12-17 DIAGNOSIS — E349 Endocrine disorder, unspecified: Secondary | ICD-10-CM

## 2022-12-17 MED ORDER — SILDENAFIL CITRATE 100 MG PO TABS
ORAL_TABLET | ORAL | 0 refills | Status: DC
Start: 2022-12-17 — End: 2023-04-07

## 2023-04-07 ENCOUNTER — Other Ambulatory Visit: Payer: Self-pay | Admitting: Primary Care

## 2023-04-07 DIAGNOSIS — E349 Endocrine disorder, unspecified: Secondary | ICD-10-CM

## 2023-04-07 MED ORDER — SILDENAFIL CITRATE 100 MG PO TABS
ORAL_TABLET | ORAL | 0 refills | Status: DC
Start: 2023-04-07 — End: 2023-08-08

## 2023-06-08 ENCOUNTER — Ambulatory Visit: Admitting: Primary Care

## 2023-06-08 ENCOUNTER — Encounter: Payer: Self-pay | Admitting: Primary Care

## 2023-06-08 VITALS — BP 142/84 | HR 62 | Temp 98.0°F | Ht 73.0 in | Wt 273.0 lb

## 2023-06-08 DIAGNOSIS — L02412 Cutaneous abscess of left axilla: Secondary | ICD-10-CM | POA: Insufficient documentation

## 2023-06-08 HISTORY — DX: Cutaneous abscess of left axilla: L02.412

## 2023-06-08 MED ORDER — SULFAMETHOXAZOLE-TRIMETHOPRIM 800-160 MG PO TABS
1.0000 | ORAL_TABLET | Freq: Two times a day (BID) | ORAL | 0 refills | Status: DC
Start: 2023-06-08 — End: 2023-08-25

## 2023-06-08 NOTE — Progress Notes (Signed)
 Subjective:    Patient ID: Daniel Sosa, male    DOB: 22-Jun-1980, 43 y.o.   MRN: [id number]  HPI  Daniel Sosa is a very pleasant 43 y.o. male with a history of hypertension and testosterone  deficiency who presents today to discuss skin mass.  Symptom onset 2 days ago with a painful, swollen, red mass to the left axilla. Yesterday he noticed a red rash around the site.   He denies fevers, drainage from the site, itching. Today he's noticed some improvement. He did switch deodorant last weekend, otherwise there has been no other change. 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    Review of Systems  Constitutional:  Negative for fever.  Skin:  Positive for color change, rash and wound.         Past Medical History:  Diagnosis Date   Elevated blood pressure    Testosterone  deficiency     Social History   Socioeconomic History   Marital status: Married    Spouse name: Not on file   Number of children: Not on file   Years of education: Not on file   Highest education level: Not on file  Occupational History   Not on file  Tobacco Use   Smoking status: Never   Smokeless tobacco: Never  Substance and Sexual Activity   Alcohol use: No    Alcohol/week: 0.0 standard drinks of alcohol   Drug use: No   Sexual activity: Not on file  Other Topics Concern   Not on file  Social History Narrative   Single.   Works for [employer] for 3 years.   Had 1 daughter, 23 year old.   Enjoys playing tennis, basketball.   Social Drivers of Corporate investment banker Strain: Not on file  Food Insecurity: Not on file  Transportation Needs: Not on file  Physical Activity: Not on file  Stress: Not on file  Social Connections: Not on file  Intimate Partner Violence: Not on file    Past Surgical History:  Procedure Laterality Date   BACK SURGERY     HERNIA REPAIR     Age 42    Family History  Problem Relation Age of Onset   Cancer Mother         Cervical    Hypertension Mother    Stroke Maternal Grandmother    Heart failure Maternal Grandmother    Diabetes Maternal Grandmother     No Known Allergies  Current Outpatient Medications on File Prior to Visit  Medication Sig Dispense Refill   losartan  (COZAAR ) 50 MG tablet TAKE 1 TABLET (50 MG TOTAL) BY MOUTH DAILY. FOR BLOOD PRESSURE. 90 tablet 3   sildenafil  (VIAGRA ) 100 MG tablet Take 1/4th tablet by mouth 30-60 minutes prior to intercourse as needed. 30 tablet 0   No current facility-administered medications on file prior to visit.    BP (!) 142/84   Pulse 62   Temp 98 F (36.7 C) (Oral)   Ht 6\' 1"  (1.854 m)   Wt 273 lb (123.8 kg)   SpO2 98%   BMI 36.02 kg/m  Objective:   Physical Exam Constitutional:      Appearance: He is not ill-appearing.  Cardiovascular:     Rate and Rhythm: Normal rate and regular rhythm.  Pulmonary:     Effort: Pulmonary effort is normal.     Breath sounds: Normal breath sounds.  Skin:    General: Skin is warm.  Findings: Erythema present.     Comments: 2 cm rounded, subcutaneous, semi-firm mass with mild erythema to left axilla. Tender. No opening. Mild linear rash to left upper axilla.            Assessment & Plan:  Abscess of axilla, left Assessment & Plan: Exam today representative. Site is not suitable for I&D.  Start Bactrim DS (sulfamethoxazole/trimethoprim) tablets. Take 1 tablet by mouth twice daily for 7 days. Discussed warm compresses to facilitate drainage.   He will update early next week if no improvement.    Orders: -     Sulfamethoxazole-Trimethoprim; Take 1 tablet by mouth 2 (two) times daily.  Dispense: 14 tablet; Refill: 0        Gabriel John, NP

## 2023-06-08 NOTE — Assessment & Plan Note (Signed)
 Exam today representative. Site is not suitable for I&D.  Start Bactrim DS (sulfamethoxazole/trimethoprim) tablets. Take 1 tablet by mouth twice daily for 7 days. Discussed warm compresses to facilitate drainage.   He will update early next week if no improvement.

## 2023-06-08 NOTE — Patient Instructions (Addendum)
 Start Bactrim DS (sulfamethoxazole/trimethoprim) tablets for the infection. Take 1 tablet by mouth twice daily for 7 days.  Apply warm compresses to the site.  Tylenol is fine to take.   Please update me Monday next week if no improvement.   It was a pleasure to see you today! Exam today with

## 2023-07-02 ENCOUNTER — Other Ambulatory Visit: Payer: Self-pay | Admitting: Primary Care

## 2023-07-02 DIAGNOSIS — I10 Essential (primary) hypertension: Secondary | ICD-10-CM

## 2023-07-03 NOTE — Telephone Encounter (Signed)
 Patient is due for CPE/follow up, this will be required prior to any further refills.  Please schedule, thank you!

## 2023-07-04 NOTE — Telephone Encounter (Signed)
 Spoke to pt, scheduled cpe for 08/17/23, due to pt's work schedule.

## 2023-08-03 ENCOUNTER — Other Ambulatory Visit: Payer: Self-pay | Admitting: Primary Care

## 2023-08-03 DIAGNOSIS — I10 Essential (primary) hypertension: Secondary | ICD-10-CM

## 2023-08-06 DIAGNOSIS — E349 Endocrine disorder, unspecified: Secondary | ICD-10-CM

## 2023-08-08 MED ORDER — SILDENAFIL CITRATE 100 MG PO TABS: ORAL_TABLET | ORAL | 0 refills | Status: DC

## 2023-08-10 MED ORDER — SILDENAFIL CITRATE 100 MG PO TABS: ORAL_TABLET | ORAL | 0 refills | Status: DC

## 2023-08-17 ENCOUNTER — Encounter: Admitting: Primary Care

## 2023-08-25 ENCOUNTER — Ambulatory Visit (INDEPENDENT_AMBULATORY_CARE_PROVIDER_SITE_OTHER): Admitting: Primary Care

## 2023-08-25 ENCOUNTER — Encounter: Payer: Self-pay | Admitting: Primary Care

## 2023-08-25 ENCOUNTER — Ambulatory Visit: Payer: Self-pay | Admitting: Primary Care

## 2023-08-25 VITALS — BP 142/70 | HR 62 | Temp 98.1°F | Ht 73.0 in | Wt 270.0 lb

## 2023-08-25 DIAGNOSIS — Z0000 Encounter for general adult medical examination without abnormal findings: Secondary | ICD-10-CM

## 2023-08-25 DIAGNOSIS — I10 Essential (primary) hypertension: Secondary | ICD-10-CM | POA: Diagnosis not present

## 2023-08-25 DIAGNOSIS — E785 Hyperlipidemia, unspecified: Secondary | ICD-10-CM

## 2023-08-25 LAB — LIPID PANEL
Cholesterol: 192 mg/dL (ref 0–200)
HDL: 37.7 mg/dL — ABNORMAL LOW (ref >39.00)
LDL Cholesterol: 131 mg/dL — ABNORMAL HIGH (ref 0–99)
NonHDL: 154.26
Total CHOL/HDL Ratio: 5
Triglycerides: 114 mg/dL (ref 0.0–149.0)
VLDL: 22.8 mg/dL (ref 0.0–40.0)

## 2023-08-25 LAB — COMPREHENSIVE METABOLIC PANEL WITH GFR
ALT: 22 U/L (ref 0–53)
AST: 17 U/L (ref 0–37)
Albumin: 4.8 g/dL (ref 3.5–5.2)
Alkaline Phosphatase: 66 U/L (ref 39–117)
BUN: 17 mg/dL (ref 6–23)
CO2: 29 meq/L (ref 19–32)
Calcium: 9.5 mg/dL (ref 8.4–10.5)
Chloride: 104 meq/L (ref 96–112)
Creatinine, Ser: 0.83 mg/dL (ref 0.40–1.50)
GFR: 108.92 mL/min (ref >60.00)
Glucose, Bld: 87 mg/dL (ref 70–99)
Potassium: 4.3 meq/L (ref 3.5–5.1)
Sodium: 140 meq/L (ref 135–145)
Total Bilirubin: 0.5 mg/dL (ref 0.2–1.2)
Total Protein: 7.3 g/dL (ref 6.0–8.3)

## 2023-08-25 LAB — CBC
HCT: 42.3 % (ref 39.0–52.0)
Hemoglobin: 14.3 g/dL (ref 13.0–17.0)
MCHC: 33.7 g/dL (ref 30.0–36.0)
MCV: 87.7 fl (ref 78.0–100.0)
Platelets: 231 K/uL (ref 150.0–400.0)
RBC: 4.82 Mil/uL (ref 4.22–5.81)
RDW: 13 % (ref 11.5–15.5)
WBC: 6.5 K/uL (ref 4.0–10.5)

## 2023-08-25 MED ORDER — LOSARTAN POTASSIUM 100 MG PO TABS: 100.0000 mg | ORAL_TABLET | Freq: Every day | ORAL | 3 refills | Status: DC

## 2023-08-25 MED ORDER — LOSARTAN POTASSIUM 100 MG PO TABS: 100.0000 mg | ORAL_TABLET | Freq: Every day | ORAL | 3 refills | Status: AC

## 2023-08-25 NOTE — Assessment & Plan Note (Signed)
 Repeat lipid panel pending.  Commended him on dietary changes.  Encouraged exercise.

## 2023-08-25 NOTE — Patient Instructions (Signed)
 We increased the dose of your losartan  blood pressure pill to 100 mg daily.  I sent a new prescription to [facility].  Stop by the lab prior to leaving today. I will notify you of your results once received.   Please notify me if your blood pressure readings remain at or above 140/90 consistently.  It was a pleasure to see you today!

## 2023-08-25 NOTE — Assessment & Plan Note (Signed)
Immunizations UTD.  Discussed the importance of a healthy diet and regular exercise in order for weight loss, and to reduce the risk of further co-morbidity.  Exam stable. Labs pending.  Follow up in 1 year for repeat physical.  

## 2023-08-25 NOTE — Assessment & Plan Note (Signed)
 Uncontrolled today, also with home readings in prior visits.  We discussed to continue to work on healthy lifestyle changes. Increase losartan  to 100 mg daily.  He will continue to monitor his blood pressure at home and report if readings are consistently above 140/90. CMP pending.

## 2023-08-25 NOTE — Progress Notes (Signed)
 Subjective:    Patient ID: Daniel Sosa, male    DOB: 1980-04-26, 43 y.o.   MRN: [id number]  HPI  Daniel Sosa is a very pleasant 43 y.o. male who presents today for complete physical and follow up of chronic conditions.  Immunizations: -Tetanus: Completed in 2023 -Pneumonia: Completed   Diet: Fair diet.  Exercise: Regular exercise.  Eye exam: Completes annually  Dental exam: Completes semi-annually     BP Readings from Last 3 Encounters:  08/25/23 (!) 142/70  06/08/23 (!) 142/84  06/09/22 136/80    Wt Readings from Last 3 Encounters:  08/25/23 270 lb (122.5 kg)  06/08/23 273 lb (123.8 kg)  06/09/22 273 lb (123.8 kg)   He is checking his BP at home which is running 140/70-80.  He denies headaches, dizziness, chest pain.  He does have a family history of hypertension in his mother and heart disease in his grandparents.   Review of Systems  Constitutional:  Negative for unexpected weight change.  HENT:  Negative for rhinorrhea.   Respiratory:  Negative for cough and shortness of breath.   Cardiovascular:  Negative for chest pain.  Gastrointestinal:  Negative for constipation and diarrhea.  Genitourinary:  Negative for difficulty urinating.  Musculoskeletal:  Negative for arthralgias and myalgias.  Skin:  Negative for rash.  Allergic/Immunologic: Negative for environmental allergies.  Neurological:  Negative for dizziness, numbness and headaches.  Psychiatric/Behavioral:  The patient is not nervous/anxious.          Past Medical History:  Diagnosis Date   Abscess of axilla, left 06/08/2023   Elevated blood pressure    Testosterone  deficiency     Social History   Socioeconomic History   Marital status: Married    Spouse name: Not on file   Number of children: Not on file   Years of education: Not on file   Highest education level: Not on file  Occupational History   Not on file  Tobacco Use   Smoking status: Never   Smokeless tobacco:  Never  Substance and Sexual Activity   Alcohol use: No    Alcohol/week: 0.0 standard drinks of alcohol   Drug use: No   Sexual activity: Not on file  Other Topics Concern   Not on file  Social History Narrative   Single.   Works for [employer] for 3 years.   Had 1 daughter, 81 year old.   Enjoys playing tennis, basketball.   Social Drivers of Corporate investment banker Strain: Not on file  Food Insecurity: Not on file  Transportation Needs: Not on file  Physical Activity: Not on file  Stress: Not on file  Social Connections: Not on file  Intimate Partner Violence: Not on file    Past Surgical History:  Procedure Laterality Date   BACK SURGERY     HERNIA REPAIR     Age 28    Family History  Problem Relation Age of Onset   Cancer Mother        Cervical    Hypertension Mother    Stroke Maternal Grandmother    Heart failure Maternal Grandmother    Diabetes Maternal Grandmother     No Known Allergies  Current Outpatient Medications on File Prior to Visit  Medication Sig Dispense Refill   sildenafil  (VIAGRA ) 100 MG tablet Take 1/4th tablet by mouth 30-60 minutes prior to intercourse as needed. 30 tablet 0   No current facility-administered medications on file prior to visit.  BP (!) 142/70   Pulse 62   Temp 98.1 F (36.7 C) (Temporal)   Ht 6' 1 (1.854 m)   Wt 270 lb (122.5 kg)   SpO2 96%   BMI 35.62 kg/m  Objective:   Physical Exam HENT:     Right Ear: Tympanic membrane and ear canal normal.     Left Ear: Tympanic membrane and ear canal normal.  Eyes:     Pupils: Pupils are equal, round, and reactive to light.  Cardiovascular:     Rate and Rhythm: Normal rate and regular rhythm.  Pulmonary:     Effort: Pulmonary effort is normal.     Breath sounds: Normal breath sounds.  Abdominal:     General: Bowel sounds are normal.     Palpations: Abdomen is soft.     Tenderness: There is no abdominal tenderness.  Musculoskeletal:        General: Normal  range of motion.     Cervical back: Neck supple.  Skin:    General: Skin is warm and dry.  Neurological:     Mental Status: He is alert and oriented to person, place, and time.     Cranial Nerves: No cranial nerve deficit.     Deep Tendon Reflexes:     Reflex Scores:      Patellar reflexes are 2+ on the right side and 2+ on the left side. Psychiatric:        Mood and Affect: Mood normal.           Assessment & Plan:  Preventative health care Assessment & Plan: Immunizations UTD.  Discussed the importance of a healthy diet and regular exercise in order for weight loss, and to reduce the risk of further co-morbidity.  Exam stable. Labs pending.  Follow up in 1 year for repeat physical.    Essential hypertension Assessment & Plan: Uncontrolled today, also with home readings in prior visits.  We discussed to continue to work on healthy lifestyle changes. Increase losartan  to 100 mg daily.  He will continue to monitor his blood pressure at home and report if readings are consistently above 140/90. CMP pending.  Orders: -     CBC -     Losartan  Potassium; Take 1 tablet (100 mg total) by mouth daily. for blood pressure.  Dispense: 90 tablet; Refill: 3  Hyperlipidemia, unspecified hyperlipidemia type Assessment & Plan: Repeat lipid panel pending.  Commended him on dietary changes.  Encouraged exercise.  Orders: -     Lipid panel -     Comprehensive metabolic panel with GFR        Comer MARLA Gaskins, NP

## 2024-01-06 ENCOUNTER — Ambulatory Visit: Payer: Self-pay

## 2024-01-06 ENCOUNTER — Encounter: Payer: Self-pay | Admitting: Family Medicine

## 2024-01-06 ENCOUNTER — Ambulatory Visit (INDEPENDENT_AMBULATORY_CARE_PROVIDER_SITE_OTHER): Admitting: Family Medicine

## 2024-01-06 VITALS — BP 163/86 | HR 68 | Resp 16 | Ht 73.0 in | Wt 275.1 lb

## 2024-01-06 DIAGNOSIS — M940 Chondrocostal junction syndrome [Tietze]: Secondary | ICD-10-CM

## 2024-01-06 MED ORDER — PREDNISONE 10 MG PO TABS: ORAL_TABLET | ORAL | 0 refills | Status: AC

## 2024-01-06 NOTE — Telephone Encounter (Signed)
 FYI Only or Action Required?: Action required by provider: request for appointment and clinical question for provider.  Patient was last seen in primary care on 08/25/2023 by Daniel Comer POUR, NP.  Called Nurse Triage reporting Chest Pain.  Symptoms began several days ago.  Interventions attempted: OTC medications: Tylenol.  Symptoms are: unchanged.  Triage Disposition: See Physician Within 24 Hours  Patient/caregiver understands and will follow disposition?: Yes  Copied from CRM #[id number]. Topic: Clinical - Red Word Triage >> Jan 06, 2024  8:38 AM Thersia BROCKS wrote: [facility] that prompted transfer to Nurse Triage: Patient called EMS last night because he was having chest pain , still hurts, EMS thinks he may pulled a muscle but wanted to followup with provider Reason for Disposition  [1] Chest pain lasts < 5 minutes AND [2] NO chest pain or cardiac symptoms (e.g., breathing difficulty, sweating) now  (Exception: Chest pains that last only a few seconds.)  Answer Assessment - Initial Assessment Questions 1. LOCATION: Where does it hurt?       Left Side  2. RADIATION: Does the pain go anywhere else? (e.g., into neck, jaw, arms, back)     Back of the Shoulder Blade  3. ONSET: When did the chest pain begin? (Minutes, hours or days)      Several Days  4. PATTERN: Does the pain come and go, or has it been constant since it started?  Does it get worse with exertion?      Intermittent, worse with coughing  5. DURATION: How long does it last (e.g., seconds, minutes, hours)     Minutes  6. SEVERITY: How bad is the pain?  (e.g., Scale 1-10; mild, moderate, or severe)     Mild to Moderate  7. CARDIAC RISK FACTORS: Do you have any history of heart problems or risk factors for heart disease? (e.g., angina, prior heart attack; diabetes, high blood pressure, high cholesterol, smoker, or strong family history of heart disease)     HTN  8. PULMONARY RISK FACTORS: Do you have  any history of lung disease?  (e.g., blood clots in lung, asthma, emphysema, birth control pills)     No  9. CAUSE: What do you think is causing the chest pain?     Unsure  10. OTHER SYMPTOMS: Do you have any other symptoms? (e.g., dizziness, nausea, vomiting, sweating, fever, difficulty breathing, cough)       Denies  Protocols used: Chest Pain-A-AH

## 2024-01-06 NOTE — Progress Notes (Signed)
 Established patient visit   Patient: Daniel Sosa   DOB: Apr 27, 1980   43 y.o. Male  MRN: [id number] Visit Date: 01/06/2024  Today's healthcare provider: Nancyann Perry, MD   Chief Complaint  Patient presents with   chest Discomfort    Left side chest pain/discomfort. Patient called EMS last night because he was having chest pain and body shaking, still hurts, EMS thinks he may pulled a muscle. Reports BP when they checked was 240/118 but checked BP after before EMS left and it was 156/84. Patient took 3 of 500 mg of Tylenol this morning.   Subjective    Discussed the use of AI scribe software for clinical note transcription with the patient, who gave verbal consent to proceed.  History of Present Illness   VISHAAL Sosa is a 43 year old male with hypertension who presents with chest pain.  He has been experiencing chest pain that began last night and possibly the day before. The pain is described as feeling like 'somebody was stepping on my chest.' It is aggravated by certain movements and deep breaths, and feels like a pulling sensation. Holding his arm up alleviates some of the pressure. He took Tylenol this morning, which provided some relief. The chest pain is similar to what he experienced yesterday. He has not used ice packs but applied a heating pad after the EMS visit.  He experienced a shaking episode involving his whole body, including his arms and legs, while standing up. This occurred last night, prompting him to call paramedics. His blood pressure was recorded at 240/118 initially, which later decreased to 156/86 after he calmed down. He has been on losartan  for hypertension, with recent readings around 138/140.  He had a sinus infection over the past week, treated with Mucinex, and experienced a few episodes of epistaxis. He reports coughing up phlegm over the weekend, but currently has a dry cough.  Family history is significant for heart problems, with  both grandparents having had heart attacks and congestive heart failure. He is unsure about his mother's heart health.  No shortness of breath. Reports chest pain aggravated by movement and deep breaths, alleviated by arm position.     Reviewed 12 lead ECG done by EMS 01/06/2024     Normal sinus rhythm     No ischemic or acute changes  Medications: Outpatient Medications Prior to Visit  Medication Sig   losartan  (COZAAR ) 100 MG tablet Take 1 tablet (100 mg total) by mouth daily. for blood pressure.   sildenafil  (VIAGRA ) 100 MG tablet Take 1/4th tablet by mouth 30-60 minutes prior to intercourse as needed.   No facility-administered medications prior to visit.   Review of Systems  Constitutional:  Negative for appetite change, chills and fever.  Respiratory:  Negative for chest tightness, shortness of breath and wheezing.   Cardiovascular:  Negative for chest pain and palpitations.  Gastrointestinal:  Negative for abdominal pain, nausea and vomiting.       Objective    BP (!) 163/86 (BP Location: Left Arm, Patient Position: Sitting, Cuff Size: Large)   Pulse 68   Resp 16   Ht 6' 1 (1.854 m)   Wt 275 lb 1.6 oz (124.8 kg)   SpO2 100%   BMI 36.30 kg/m   Physical Exam   General: Appearance:    Mildly obese male in no acute distress  Eyes:    PERRL, conjunctiva/corneas clear, EOM's intact       Lungs:  Clear to auscultation bilaterally, respirations unlabored  Heart:    Normal heart rate. Normal rhythm. No murmurs, rubs, or gallops.    MS:   All extremities are intact.  Pain reproduced by deep inhalation, coughing, palpation of mid and lower anterolateral left chest wall and muscles of left shoulder. Also reproduced by internal rotation of left shoulder. No spine or paraspinous tenderness.    Neurologic:   Awake, alert, oriented x 3. No apparent focal neurological defect.          Assessment & Plan        Chondrocostal junction syndrome (Tietze's syndrome) Intermittent  chest pain due to chest wall inflammation, likely related to recent coughing. No cardiac involvement. Pain improved with Tylenol. - Prescribed prednisone  for one week. - Advised follow-up if not 90% improved by next week. - Instructed to return to ER if symptoms worsen or change.  Hypertension Recent elevated blood pressure episode likely due to anxiety and pain. Improved after calming. On maximum dose of losartan . Prednisone  may elevate blood pressure temporarily. - Monitor blood pressure at home. - Advised prednisone  may elevate blood pressure temporarily.     Follow up with PCP if not nearly resolved over the weekend.         Nancyann Perry, MD  [facility] [phone] (phone) [phone] (fax)  [facility]

## 2024-01-06 NOTE — Telephone Encounter (Signed)
 Patient is scheduled to see outside provider this afternoon at 3:20 pm.

## 2024-01-06 NOTE — Telephone Encounter (Signed)
 Noted. Agree with nursing triage decision. Appreciate Dr Lyle evaluation.

## 2024-02-23 DIAGNOSIS — E349 Endocrine disorder, unspecified: Secondary | ICD-10-CM

## 2024-02-23 MED ORDER — SILDENAFIL CITRATE 100 MG PO TABS: ORAL_TABLET | ORAL | 0 refills | Status: DC

## 2024-02-23 MED ORDER — SILDENAFIL CITRATE 100 MG PO TABS: ORAL_TABLET | ORAL | 0 refills | Status: AC
